# Patient Record
Sex: Female | Born: 1955 | Race: White | Hispanic: No | Marital: Married | Smoking: Former smoker
Health system: Southern US, Community
[De-identification: ages and names within clinical notes are randomized; demographics above are authoritative.]

## PROBLEM LIST (undated history)

## (undated) DIAGNOSIS — E039 Hypothyroidism, unspecified: Secondary | ICD-10-CM

## (undated) DIAGNOSIS — K529 Noninfective gastroenteritis and colitis, unspecified: Secondary | ICD-10-CM

## (undated) DIAGNOSIS — R112 Nausea with vomiting, unspecified: Secondary | ICD-10-CM

## (undated) DIAGNOSIS — H409 Unspecified glaucoma: Secondary | ICD-10-CM

## (undated) DIAGNOSIS — Z9889 Other specified postprocedural states: Secondary | ICD-10-CM

## (undated) DIAGNOSIS — C50312 Malignant neoplasm of lower-inner quadrant of left female breast: Principal | ICD-10-CM

## (undated) DIAGNOSIS — D649 Anemia, unspecified: Secondary | ICD-10-CM

## (undated) DIAGNOSIS — F419 Anxiety disorder, unspecified: Secondary | ICD-10-CM

## (undated) DIAGNOSIS — R5383 Other fatigue: Secondary | ICD-10-CM

## (undated) DIAGNOSIS — J45909 Unspecified asthma, uncomplicated: Secondary | ICD-10-CM

## (undated) HISTORY — PX: EYE SURGERY: SHX253

## (undated) HISTORY — PX: COLONOSCOPY W/ POLYPECTOMY: SHX1380

## (undated) HISTORY — PX: WISDOM TOOTH EXTRACTION: SHX21

## (undated) HISTORY — DX: Hypothyroidism, unspecified: E03.9

## (undated) HISTORY — DX: Unspecified glaucoma: H40.9

## (undated) HISTORY — DX: Malignant neoplasm of lower-inner quadrant of left female breast: C50.312

## (undated) HISTORY — DX: Noninfective gastroenteritis and colitis, unspecified: K52.9

---

## 2008-06-01 ENCOUNTER — Ambulatory Visit: Payer: Self-pay | Admitting: Endocrinology

## 2012-03-07 LAB — TSH: TSH: 2.27 u[IU]/mL (ref <=5.90)

## 2013-08-14 LAB — BASIC METABOLIC PANEL
BUN: 7 mg/dL (ref 4–21)
Creatinine: 0.7 mg/dL (ref <=1.1)
Glucose: 89 mg/dL
Potassium: 4.1 mmol/L (ref 3.4–5.3)
Sodium: 142 mmol/L (ref 137–147)

## 2013-08-14 LAB — LIPID PANEL
CHOLESTEROL: 199 mg/dL (ref 0–200)
HDL: 92 mg/dL — AB (ref 35–70)
LDL Cholesterol: 93 mg/dL
Triglycerides: 68 mg/dL (ref 40–160)

## 2013-08-14 LAB — CBC AND DIFFERENTIAL
HEMATOCRIT: 45 % (ref 36–46)
HEMOGLOBIN: 15.3 g/dL (ref 12.0–16.0)
PLATELETS: 254 10*3/uL (ref 150–399)
WBC: 10.2 10*3/mL

## 2013-08-14 LAB — HEPATIC FUNCTION PANEL
ALK PHOS: 61 U/L (ref 25–125)
ALT: 16 U/L (ref 7–35)
AST: 22 U/L (ref 13–35)
Bilirubin, Total: 0.4 mg/dL

## 2013-08-14 LAB — TSH: TSH: 0.3 u[IU]/mL — AB (ref <=5.90)

## 2013-08-14 LAB — HM PAP SMEAR: HM Pap smear: NEGATIVE

## 2014-01-12 ENCOUNTER — Encounter: Payer: Self-pay | Admitting: Internal Medicine

## 2014-01-15 LAB — HM MAMMOGRAPHY

## 2014-02-07 ENCOUNTER — Ambulatory Visit (AMBULATORY_SURGERY_CENTER): Payer: BC Managed Care – PPO

## 2014-02-07 VITALS — Ht 64.0 in | Wt 143.0 lb

## 2014-02-07 DIAGNOSIS — Z800 Family history of malignant neoplasm of digestive organs: Secondary | ICD-10-CM

## 2014-02-07 MED ORDER — MOVIPREP 100 G PO SOLR: 1.0000 | Freq: Once | ORAL | Status: DC

## 2014-02-07 NOTE — Progress Notes (Signed)
Allergy to eggs but has had flu shot with no problem. No diet/weight loss meds No home oxygen No problems with anesthesia in the past  No relatives with problems with anesthesia  Has email  Emmi instructions given for colonoscopy

## 2014-02-14 ENCOUNTER — Encounter: Payer: Self-pay | Admitting: Internal Medicine

## 2014-02-15 ENCOUNTER — Telehealth: Payer: Self-pay | Admitting: Internal Medicine

## 2014-02-15 NOTE — Telephone Encounter (Signed)
RN instructed on Miralax prep.  Pt voiced understanding.

## 2014-02-21 ENCOUNTER — Encounter: Payer: Self-pay | Admitting: Internal Medicine

## 2014-02-21 ENCOUNTER — Ambulatory Visit (AMBULATORY_SURGERY_CENTER): Payer: BC Managed Care – PPO | Admitting: Internal Medicine

## 2014-02-21 VITALS — BP 138/84 | HR 62 | Temp 98.9°F | Resp 18 | Ht 64.0 in | Wt 143.0 lb

## 2014-02-21 DIAGNOSIS — Z800 Family history of malignant neoplasm of digestive organs: Secondary | ICD-10-CM

## 2014-02-21 DIAGNOSIS — D126 Benign neoplasm of colon, unspecified: Secondary | ICD-10-CM

## 2014-02-21 DIAGNOSIS — Z8379 Family history of other diseases of the digestive system: Secondary | ICD-10-CM

## 2014-02-21 DIAGNOSIS — Z1211 Encounter for screening for malignant neoplasm of colon: Secondary | ICD-10-CM

## 2014-02-21 MED ORDER — SODIUM CHLORIDE 0.9 % IV SOLN: 500.0000 mL | INTRAVENOUS | Status: DC

## 2014-02-21 NOTE — Op Note (Signed)
[facility]  [address]. [place], [address]   COLONOSCOPY PROCEDURE REPORT  PATIENT: Laura Landry, Laura Landry  MR#: [id number] BIRTHDATE: [birth year] , 22  yrs. old GENDER: Female ENDOSCOPIST: Lafayette Dragon, MD REFERRED JQ:[id number] Little, M.D. PROCEDURE DATE:  02/21/2014 PROCEDURE:   Colonoscopy with biopsy, Colonoscopy with snare polypectomy, and Colonoscopy with cold biopsy polypectomy First Screening Colonoscopy - Avg.  risk and is 50 yrs.  old or older - No.  Prior Negative Screening - Now for repeat screening. 10 or more years since last screening  History of Adenoma - Now for follow-up colonoscopy & has been > or = to 3 yrs.  N/A  Polyps Removed Today? Yes. 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  Not treated.  Positive family history of ulcerative colitis in a grandparent.  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 MEDICATIONS: MAC sedation, administered by CRNA and Propofol (Diprivan) 320 mg IV  DESCRIPTION OF PROCEDURE:   After the risks benefits and alternatives of the procedure were thoroughly explained, informed consent was obtained.  A digital rectal exam revealed no abnormalities of the rectum.   The LB [id number]  endoscope was introduced through the anus and advanced to the cecum, which was identified by both the appendix and ileocecal valve. No adverse events experienced.   The quality of the prep was good, using MoviPrep  The instrument was then slowly withdrawn as the colon was fully examined.      COLON FINDINGS: Two pedunculated polyps were found in the sigmoid colon. large 15 mm pedunculated polyp at 20 cm, and 7 mm sessile polyp at 15 cm, Botth [polyps removed , with hot snare and with cold biopsy. random biopsies of the colon were obtained from the right and the descending colon to rule out nonspecific  colitis . Retroflexed views revealed no abnormalities. The time to cecum=8 minutes 32 seconds.  Withdrawal time=15 minutes 57 seconds.  The scope was withdrawn and the procedure completed. COMPLICATIONS: There were no complications.  ENDOSCOPIC IMPRESSION: Two pedunculated polyp  at 20 cm and sessile polyp at 15 cm were found in the sigmoid colon; polypectomy was performed with cold forceps and with the cold snare  Multiple biopsies from right and the left colon were obtained to follow up on nonspecific colitis from colonoscopy in 1998, endoscopically mucosa appears normal  RECOMMENDATIONS: 1.  Await pathology results 2.  recall colonoscopy pending path report   eSigned:  Lafayette Dragon, MD 02/21/2014 2:13 PM   cc:   PATIENT NAME:  Bernyce, Brimley MR#: [id number]

## 2014-02-21 NOTE — Progress Notes (Signed)
Called to room to assist during endoscopic procedure.  Patient ID and intended procedure confirmed with present staff. Received instructions for my participation in the procedure from the performing physician.  

## 2014-02-21 NOTE — Progress Notes (Signed)
Procedure ends, to recovery, report given and VSS. 

## 2014-02-21 NOTE — Patient Instructions (Signed)
Discharge instructions given with verbal understanding. Handout on polyps. Resume previous medications. YOU HAD AN ENDOSCOPIC PROCEDURE TODAY AT THE [facility]: Refer to the procedure report that was given to you for any specific questions about what was found during the examination.  If the procedure report does not answer your questions, please call your gastroenterologist to clarify.  If you requested that your care partner not be given the details of your procedure findings, then the procedure report has been included in a sealed envelope for you to review at your convenience later.  YOU SHOULD EXPECT: Some feelings of bloating in the abdomen. Passage of more gas than usual.  Walking can help get rid of the air that was put into your GI tract during the procedure and reduce the bloating. If you had a lower endoscopy (such as a colonoscopy or flexible sigmoidoscopy) you may notice spotting of blood in your stool or on the toilet paper. If you underwent a bowel prep for your procedure, then you may not have a normal bowel movement for a few days.  DIET: Your first meal following the procedure should be a light meal and then it is ok to progress to your normal diet.  A half-sandwich or bowl of soup is an example of a good first meal.  Heavy or fried foods are harder to digest and may make you feel nauseous or bloated.  Likewise meals heavy in dairy and vegetables can cause extra gas to form and this can also increase the bloating.  Drink plenty of fluids but you should avoid alcoholic beverages for 24 hours.  ACTIVITY: Your care partner should take you home directly after the procedure.  You should plan to take it easy, moving slowly for the rest of the day.  You can resume normal activity the day after the procedure however you should NOT DRIVE or use heavy machinery for 24 hours (because of the sedation medicines used during the test).    SYMPTOMS TO REPORT IMMEDIATELY: A  gastroenterologist can be reached at any hour.  During normal business hours, 8:30 AM to 5:00 PM Monday through Friday, call [phone].  After hours and on weekends, please call the GI answering service at [phone] who will take a message and have the physician on call contact you.   Following lower endoscopy (colonoscopy or flexible sigmoidoscopy):  Excessive amounts of blood in the stool  Significant tenderness or worsening of abdominal pains  Swelling of the abdomen that is new, acute  Fever of 100F or higher  FOLLOW UP: If any biopsies were taken you will be contacted by phone or by letter within the next 1-3 weeks.  Call your gastroenterologist if you have not heard about the biopsies in 3 weeks.  Our staff will call the home number listed on your records the next business day following your procedure to check on you and address any questions or concerns that you may have at that time regarding the information given to you following your procedure. This is a courtesy call and so if there is no answer at the home number and we have not heard from you through the emergency physician on call, we will assume that you have returned to your regular daily activities without incident.  SIGNATURES/CONFIDENTIALITY: You and/or your care partner have signed paperwork which will be entered into your electronic medical record.  These signatures attest to the fact that that the information above on your After Visit Summary has been   reviewed and is understood.  Full responsibility of the confidentiality of this discharge information lies with you and/or your care-partner. 

## 2014-02-22 ENCOUNTER — Telehealth: Payer: Self-pay

## 2014-02-22 NOTE — Telephone Encounter (Signed)
  Follow up Call-  Call back number 02/21/2014  Post procedure Call Back phone  # [phone]  Permission to leave phone message Yes     Patient questions:  Do you have a fever, pain , or abdominal swelling? No. Pain Score  0 *  Have you tolerated food without any problems? Yes.    Have you been able to return to your normal activities? Yes.    Do you have any questions about your discharge instructions: Diet   No. Medications  No. Follow up visit  No.  Do you have questions or concerns about your Care? No.  Actions: * If pain score is 4 or above: No action needed, pain <4.

## 2014-02-27 ENCOUNTER — Encounter: Payer: Self-pay | Admitting: Internal Medicine

## 2014-03-01 ENCOUNTER — Encounter: Payer: Self-pay | Admitting: *Deleted

## 2014-03-01 ENCOUNTER — Encounter: Payer: Self-pay | Admitting: Internal Medicine

## 2015-05-07 LAB — CBC AND DIFFERENTIAL
HEMATOCRIT: 44 % (ref 36–46)
Hemoglobin: 14.7 g/dL (ref 12.0–16.0)
Platelets: 301 10*3/uL (ref 150–399)
WBC: 10.2 10^3/mL

## 2015-05-07 LAB — LIPID PANEL
CHOLESTEROL: 188 mg/dL (ref 0–200)
HDL: 79 mg/dL — AB (ref 35–70)
LDL Cholesterol: 92 mg/dL
Triglycerides: 84 mg/dL (ref 40–160)

## 2015-05-07 LAB — HEPATIC FUNCTION PANEL
ALT: 21 U/L (ref 7–35)
AST: 25 U/L (ref 13–35)
Alkaline Phosphatase: 25 U/L (ref 25–125)
BILIRUBIN, TOTAL: 0.5 mg/dL

## 2015-05-07 LAB — BASIC METABOLIC PANEL
BUN: 6 mg/dL (ref 4–21)
CREATININE: 0.6 mg/dL (ref <=1.1)
GLUCOSE: 82 mg/dL
POTASSIUM: 4.2 mmol/L (ref 3.4–5.3)
SODIUM: 138 mmol/L (ref 137–147)

## 2015-05-07 LAB — TSH: TSH: 0.07 u[IU]/mL — AB (ref <=5.90)

## 2015-08-15 LAB — HM PAP SMEAR: HM PAP: NEGATIVE

## 2015-12-23 ENCOUNTER — Other Ambulatory Visit: Payer: Self-pay | Admitting: Family Medicine

## 2015-12-23 DIAGNOSIS — M4727 Other spondylosis with radiculopathy, lumbosacral region: Secondary | ICD-10-CM

## 2015-12-23 DIAGNOSIS — M5440 Lumbago with sciatica, unspecified side: Secondary | ICD-10-CM

## 2015-12-24 ENCOUNTER — Ambulatory Visit
Admission: RE | Admit: 2015-12-24 | Discharge: 2015-12-24 | Disposition: A | Discharge status: Home / Self Care | Payer: BLUE CROSS/BLUE SHIELD | Source: Ambulatory Visit | Attending: Family Medicine | Admitting: Family Medicine

## 2015-12-24 ENCOUNTER — Other Ambulatory Visit: Payer: Self-pay | Admitting: Family Medicine

## 2015-12-24 DIAGNOSIS — M5440 Lumbago with sciatica, unspecified side: Secondary | ICD-10-CM

## 2015-12-29 ENCOUNTER — Other Ambulatory Visit: Payer: Self-pay

## 2016-01-07 ENCOUNTER — Ambulatory Visit (INDEPENDENT_AMBULATORY_CARE_PROVIDER_SITE_OTHER): Payer: BLUE CROSS/BLUE SHIELD | Admitting: Family Medicine

## 2016-01-07 ENCOUNTER — Encounter: Payer: Self-pay | Admitting: Family Medicine

## 2016-01-07 VITALS — BP 144/87 | HR 80 | Ht 65.0 in | Wt 152.3 lb

## 2016-01-07 DIAGNOSIS — L255 Unspecified contact dermatitis due to plants, except food: Secondary | ICD-10-CM

## 2016-01-07 DIAGNOSIS — E039 Hypothyroidism, unspecified: Secondary | ICD-10-CM

## 2016-01-07 MED ORDER — PREDNISONE 50 MG PO TABS: 50.0000 mg | ORAL_TABLET | Freq: Every day | ORAL | Status: DC

## 2016-01-07 NOTE — Patient Instructions (Signed)
Take all meds as written.     Poison [employer] ivy is a inflammation of the skin (contact dermatitis) caused by touching the allergens on the leaves of the ivy plant following previous exposure to the plant. The rash usually appears 48 hours after exposure. The rash is usually bumps (papules) or blisters (vesicles) in a linear pattern. Depending on your own sensitivity, the rash may simply cause redness and itching, or it may also progress to blisters which may break open. These must be well cared for to prevent secondary bacterial (germ) infection, followed by scarring. Keep any open areas dry, clean, dressed, and covered with an antibacterial ointment if needed. The eyes may also get puffy. The puffiness is worst in the morning and gets better as the day progresses. This dermatitis usually heals without scarring, within 2 to 3 weeks without treatment. HOME CARE INSTRUCTIONS  Thoroughly wash with soap and water as soon as you have been exposed to poison ivy. You have about one half hour to remove the plant resin before it will cause the rash. This washing will destroy the oil or antigen on the skin that is causing, or will cause, the rash. Be sure to wash under your fingernails as any plant resin there will continue to spread the rash. Do not rub skin vigorously when washing affected area. Poison ivy cannot spread if no oil from the plant remains on your body. A rash that has progressed to weeping sores will not spread the rash unless you have not washed thoroughly. It is also important to wash any clothes you have been wearing as these may carry active allergens. The rash will return if you wear the unwashed clothing, even several days later. Avoidance of the plant in the future is the best measure. Poison ivy plant can be recognized by the number of leaves. Generally, poison ivy has three leaves with flowering branches on a single stem. Diphenhydramine may be purchased over the counter and used as needed  for itching. Do not drive with this medication if it makes you drowsy.Ask your caregiver about medication for children. SEEK MEDICAL CARE IF:  Open sores develop.  Redness spreads beyond area of rash.  You notice purulent (pus-like) discharge.  You have increased pain.  Other signs of infection develop (such as fever).   This information is not intended to replace advice given to you by your health care provider. Make sure you discuss any questions you have with your health care provider.   Document Released: 07/17/2000 Document Revised: 10/12/2011 Document Reviewed: 12/26/2014 Elsevier Interactive Patient Education [employer].

## 2016-01-07 NOTE — Progress Notes (Signed)
Laura Landry, D.O. Primary care at [facility]  Subjective:    CC: New pt, here to establish care and be txed for poison ivy rash.  HPI: Laura Landry is a pleasant 61 y.o. female who presents to [place] at [facility] today for a rash started Sunday, on b/l legs, then moved to hands/arms, today now one papule on face.  Pulling weeds in yard on Friday- known poison ivy and now has pustular outbreak.  No oral lesions or DIB.   Has tolerated prednisone in the past.    Pt's PCP was Dr Rex Kras in [place] and was being worked up for back pain with him. Pt not sure if she will f/up with him about this or me.  Had x-rays- Neg.     Past Medical History  Diagnosis Date  . Hypothyroidism   . Colitis     Past Surgical History  Procedure Laterality Date  . Wisdom tooth extraction      Family History  Problem Relation Age of Onset  . Colon cancer Paternal Grandmother   . Cancer Paternal Grandmother     colon  . Cancer Mother     breast  . Hypertension Father   . Asthma Father   . Diabetes Brother     History  Drug Use No  ,  History  Alcohol Use  . 2.4 oz/week  . 4 Glasses of wine per week  ,  History  Smoking status  . Former Smoker  . Quit date: 08/03/2012  Smokeless tobacco  . Never Used  ,  History  Sexual Activity  . Sexual Activity: Yes  . Birth Control/ Protection: Post-menopausal    Patient's Medications  New Prescriptions   No medications on file  Previous Medications   ALBUTEROL (PROVENTIL HFA;VENTOLIN HFA) 108 (90 BASE) MCG/ACT INHALER    Inhale 1-2 puffs into the lungs every 6 (six) hours as needed for wheezing or shortness of breath.   CALCIUM CARBONATE-VITAMIN D (CALCIUM-VITAMIN D) 500-200 MG-UNIT TABLET    Take 1 tablet by mouth daily.   LEVOTHYROXINE (SYNTHROID, LEVOTHROID) 112 MCG TABLET    Take 112 mcg by mouth daily before breakfast.  Modified Medications   No medications on file  Discontinued Medications   No  medications on file    ALLERGIES: Clindamycin/lincomycin; Codeine; Eggs or egg-derived products; Nickel; Penicillins; and Sulfa antibiotics   Review of Systems: Full 14 point ROS performed via "adult medical history form".  Negative except for noted above    Objective:   Blood pressure 144/87, pulse 80, height 5\' 5"  (1.651 m), weight 152 lb 4.8 oz (69.083 kg). Body mass index is 25.34 kg/(m^2).  General: Well Developed, well nourished, and in no acute distress.  Neuro: Alert and oriented x3, extra-ocular muscles intact, sensation grossly intact.  HEENT: Normocephalic, atraumatic, pupils equal round reactive to light, neck supple, no gross masses, no carotid bruits, no JVD apprec Skin: no gross suspicious lesions or rashes  Cardiac: Regular rate and rhythm, no murmurs rubs or gallops.  Respiratory: Essentially clear to auscultation bilaterally. Not using accessory muscles, speaking in full sentences.  Abdominal: Soft, not grossly distended Musculoskeletal: Ambulates w/o diff, FROM * 4 ext.  Vasc: less 2 sec cap RF, warm and pink  Psych:  No HI/SI, judgement and insight good.    Impression and Recommendations:    The patient was counselled, risk factors were discussed, anticipatory guidance given.  Gross side effects, risk and  benefits, and alternatives of medications discussed with patient.  Patient is aware that all medications have potential side effects and we are unable to predict every sideeffect or drug-drug interaction that may occur.  Expresses verbal understanding and consents to current therapy plan and treatment regiment.  Note: This document was prepared using Dragon voice recognition software and may include unintentional dictation errors.

## 2016-01-08 DIAGNOSIS — E039 Hypothyroidism, unspecified: Secondary | ICD-10-CM | POA: Insufficient documentation

## 2016-01-08 NOTE — Assessment & Plan Note (Signed)
R/b meds;   supportive care d/c pt.    Red flag sx.

## 2016-01-08 NOTE — Assessment & Plan Note (Signed)
rec we get blood work in near future if pt plans to tx care to [place].

## 2016-01-08 NOTE — Progress Notes (Signed)
Subjective:    CC: here for f/up of Poison Ivy rash and elevated BP last OV   HPI: Laura Landry is a 60 y.o. female who presents to [place] at [facility] today for f/up of her rhus dermatitis she contracted less than a week ago.  She is tolerating the prednisone well- makes her feel hungry, lightheaded, and get little sleep.  Rash regressing and pt pleased with how it is resolving.      BP- checked at home one time-  140/80, 88=P.   No sx at all, no complaints.    Past Medical History  Diagnosis Date  . Hypothyroidism   . Colitis     Past Surgical History  Procedure Laterality Date  . Wisdom tooth extraction      Family History  Problem Relation Age of Onset  . Colon cancer Paternal Grandmother   . Cancer Paternal Grandmother     colon  . Cancer Mother     breast  . Hypertension Father   . Asthma Father   . Diabetes Brother     History  Drug Use No  ,  History  Alcohol Use  . 2.4 oz/week  . 4 Glasses of wine per week  ,  History  Smoking status  . Former Smoker  . Quit date: 08/03/2012  Smokeless tobacco  . Never Used  ,  History  Sexual Activity  . Sexual Activity: Yes  . Birth Control/ Protection: Post-menopausal    Current Outpatient Prescriptions on File Prior to Visit  Medication Sig Dispense Refill  . albuterol (PROVENTIL HFA;VENTOLIN HFA) 108 (90 Base) MCG/ACT inhaler Inhale 1-2 puffs into the lungs every 6 (six) hours as needed for wheezing or shortness of breath.    . Calcium Carbonate-Vitamin D (CALCIUM-VITAMIN D) 500-200 MG-UNIT tablet Take 1 tablet by mouth daily.    Marland Kitchen levothyroxine (SYNTHROID, LEVOTHROID) 112 MCG tablet Take 112 mcg by mouth daily before breakfast.    . predniSONE (DELTASONE) 50 MG tablet Take 1 tablet (50 mg total) by mouth daily. 5 tablet 0   No current facility-administered medications on file prior to visit.    Allergies  Allergen Reactions  . Clindamycin/Lincomycin Swelling  . Codeine    Same as sulfa   . Eggs Or Egg-Derived Products     Abdominal pain some swelling  . Nickel     Blisters   . Penicillins Hives, Nausea And Vomiting and Swelling  . Sulfa Antibiotics Hives and Swelling     Review of Systems: General:  Denies fever, chills, appetite changes, unexplained weight loss.  Respiratory: Denies SOB, DOE, cough, wheezing.  Cardiovascular: Denies chest pain, palpitations.  Gastrointestinal: Denies nausea, vomiting, diarrhea, abdominal pain.  Genitourinary: Denies dysuria, increased frequency, flank pain. Endocrine: Denies hot or cold intolerance, polyuria, polydipsia. Musculoskeletal: Denies myalgias, back pain, joint swelling, arthralgias, gait problems.  Skin: Denies pallor, rash, suspicious lesions.  Neurological: Denies dizziness, seizures, syncope, unexplained weakness, lightheadedness, numbness and headaches.  Psychiatric/Behavioral: Denies mood changes, suicidal or homicidal ideations, hallucinations, sleep disturbances.   Objective:  Blood pressure 142/88, pulse 76. Filed Vitals:   01/09/16 1442 01/09/16 1518  BP: 149/90 142/88  Pulse: 76   height 5\' 5"  (1.651 m), weight 152 lb 4.8 oz (69.083 kg). Body mass index is 25.34 kg/(m^2).   General: Well Developed, well nourished, and in no acute distress.  HEENT: Normocephalic, atraumatic, pupils equal round reactive to light, neck supple, No carotid bruits no JVD Skin: Warm  and dry, cap RF less 2 sec, resolving pustular rash- no longer on B/L ankles/arms and face. Still on upper inner R lower ext. Cardiac: Regular rate and rhythm, S1, S2 WNL's, no murmurs rubs or gallops Respiratory: ECTA B/L, Not using accessory muscles, speaking in full sentences. NeuroM-Sk: Ambulates w/o assistance, moves ext * 4 w/o difficulty, sensation grossly intact.  Psych: A and O *3, judgement and insight good.     Impression and Recommendations:   Prehypertension Patient will check BP at home and keep a blood pressure  log. I asked her to check at least 3 times a week- random times. Gave pt handouts on low-salt diet and healthier eating d/c pt; rec exercise 64min per day as well.  Pt understands that BP goal at 60yo is 150/90 or less and younger is 140/90 or less.   So, long d/c pt about this and ways to bring it down on her own.  She declines meds today and will monitor at home.   Rhus dermatitis Clearing and drying up nicely. She will continue meds until completed her regimen. Avoid poison ivy plant in the future   --> pt will schedule FBW in near future and f/up 3-4 wks with me for OV to recheck BP and review labs.        Note: This document was prepared using Dragon voice recognition software and may include unintentional dictation errors.

## 2016-01-09 ENCOUNTER — Ambulatory Visit (INDEPENDENT_AMBULATORY_CARE_PROVIDER_SITE_OTHER): Payer: BLUE CROSS/BLUE SHIELD | Admitting: Family Medicine

## 2016-01-09 ENCOUNTER — Encounter: Payer: Self-pay | Admitting: Family Medicine

## 2016-01-09 ENCOUNTER — Telehealth: Payer: Self-pay | Admitting: Family Medicine

## 2016-01-09 ENCOUNTER — Ambulatory Visit: Payer: BLUE CROSS/BLUE SHIELD | Admitting: Family Medicine

## 2016-01-09 VITALS — BP 142/88 | HR 76

## 2016-01-09 DIAGNOSIS — L255 Unspecified contact dermatitis due to plants, except food: Secondary | ICD-10-CM | POA: Diagnosis not present

## 2016-01-09 DIAGNOSIS — R030 Elevated blood-pressure reading, without diagnosis of hypertension: Secondary | ICD-10-CM | POA: Diagnosis not present

## 2016-01-09 NOTE — Patient Instructions (Signed)
Guidelines for a Low Sodium Diet   Low Sodium Diet A main source of sodium is table salt. The average American eats five or more teaspoons of salt each day. This is about 20 times as much as the body needs. In fact, your body needs only 1/4 teaspoon of salt every day. Sodium is found naturally in foods, but a lot of it is added during processing and preparation. Many foods that do not taste salty may still be high in sodium. Large amounts of sodium can be hidden in canned, processed and convenience foods. And sodium can be found in many foods that are served at fast food restaurants.  Sodium controls fluid balance in our bodies and maintains blood volume and blood pressure. Eating too much sodium may raise blood pressure and cause fluid retention, which could lead to swelling of the legs and feet or other health issues.  When limiting sodium in your diet, a common target is to eat less than 2,000 milligrams of sodium per day.   General Guidelines for Cutting Down on Salt Eliminate salty foods from your diet and reduce the amount of salt used in cooking. Sea salt is no better than regular salt.  Choose low sodium foods. Many salt-free or reduced salt products are available. When reading food labels, low sodium is defined as 140 mg of sodium per serving.  Salt substitutes are sometimes made from potassium, so read the label. If you are on a low potassium diet, then check with your doctor before using those salt substitutes.  Be creative and season your foods with spices, herbs, lemon, garlic, ginger, vinegar and pepper. Remove the salt shaker from the table.  Read ingredient labels to identify foods high in sodium. Items with 400 mg or more of sodium are high in sodium. High sodium food additives include salt, brine, or other items that say sodium, such as monosodium glutamate.  Eat more home-cooked meals. Foods cooked from scratch are naturally lower in sodium than most instant and boxed  mixes.  Don't use softened water for cooking and drinking since it contains added salt.  Avoid medications which contain sodium such as Alka Seltzer and Bromo Seltzer.  For more information; food composition books are available which tell how much sodium is in food. Online sources such as www.calorieking.com also list amounts.     Meats, Poultry, Fish, Legumes, Eggs and Nuts  High-Sodium Foods: Smoked, cured, salted or canned meat, fish or poultry including bacon, cold cuts, ham, frankfurters, sausage, sardines, caviar and anchovies Frozen breaded meats and dinners, such as burritos and pizza Canned entrees, such as ravioli, spam and chili Salted nuts Beans canned with salt added  Low-Sodium Alternatives: Any fresh or frozen beef, lamb, pork, poultry and fish Eggs and egg substitutes Low-sodium peanut butter Dry peas and beans (not canned) Low-sodium canned fish Drained, water or oil packed canned fish or poultry   Dairy Products  High-Sodium Foods: Buttermilk Regular and processed cheese, cheese spreads and sauces Cottage cheese  Low-Sodium Alternatives: Milk, yogurt, ice cream and ice milk Low-sodium cheeses, cream cheese, ricotta cheese and mozzarella   Breads, Grains and Cereals  High-Sodium Foods: Bread and rolls with salted tops Quick breads, self-rising flour, biscuit, pancake and waffle mixes Pizza, croutons and salted crackers Prepackaged, processed mixes for potatoes, rice, pasta and stuffing  Low-Sodium Alternatives: Breads, bagels and rolls without salted tops Muffins and most ready-to-eat cereals All rice and pasta, but do not to add salt when cooking Low-sodium corn and   flour tortillas and noodles Low-sodium crackers and breadsticks Unsalted popcorn, chips and pretzels      Vegetables and Fruits  High-Sodium Foods: Regular canned vegetables and vegetable juices Olives, pickles, sauerkraut and other pickled vegetables Vegetables made with  ham, bacon or salted pork Packaged mixes, such as scalloped or au gratin potatoes, frozen hash browns and Tater Tots Commercially prepared pasta and tomato sauces and salsa  Low-Sodium Alternatives: Fresh and frozen vegetables without sauces Low-sodium canned vegetables, sauces and juices Fresh potatoes, frozen French fries and instant mashed potatoes Low-salt tomato or V-8 juice. Most fresh, frozen and canned fruit Dried fruits   Soups  High-Sodium Foods: Regular canned and dehydrated soup, broth and bouillon Cup of noodles and seasoned ramen mixes  Low-Sodium Alternatives: Low-sodium canned and dehydrated soups, broth and bouillon Homemade soups without added salt   Fats, Desserts and Sweets  High-Sodium Foods: Soy sauce, seasoning salt, other sauces and marinades Bottled salad dressings, regular salad dressing with bacon bits Salted butter or margarine Instant pudding and cake Large portions of ketchup, mustard  Low-Sodium Alternatives: Vinegar, unsalted butter or margarine Vegetable oils and low sodium sauces and salad dressings Mayonnaise All desserts made without salt 

## 2016-01-09 NOTE — Telephone Encounter (Signed)
Patient would like a 5 month supply of her thyroid meds called in to her pharmacy

## 2016-01-09 NOTE — Assessment & Plan Note (Addendum)
Patient will check BP at home and keep a blood pressure log. I asked her to check at least 3 times a week- random times. Gave pt handouts on low-salt diet and healthier eating d/c pt; rec exercise 64min per day as well.  Pt understands that BP goal at 60yo is 150/90 or less and younger is 140/90 or less.   So, long d/c pt about this and ways to bring it down on her own.  She declines meds today and will monitor at home.

## 2016-01-09 NOTE — Telephone Encounter (Signed)
Tell pt that we will await her upcoming labs before I RF her meds for 47mo to make sure all levels look good.  Then I would have no problem refilling 6 months if everything looks good

## 2016-01-09 NOTE — Assessment & Plan Note (Addendum)
Clearing and drying up nicely. She will continue meds until completed her regimen. Avoid poison ivy plant in the future

## 2016-01-10 NOTE — Telephone Encounter (Signed)
Pt informed and is agreeable.  Charyl Bigger, CMA

## 2016-01-15 DIAGNOSIS — Z1231 Encounter for screening mammogram for malignant neoplasm of breast: Secondary | ICD-10-CM

## 2016-01-31 ENCOUNTER — Other Ambulatory Visit: Payer: Self-pay

## 2016-01-31 DIAGNOSIS — R030 Elevated blood-pressure reading, without diagnosis of hypertension: Secondary | ICD-10-CM

## 2016-01-31 DIAGNOSIS — E039 Hypothyroidism, unspecified: Secondary | ICD-10-CM

## 2016-01-31 DIAGNOSIS — Z0000 Encounter for general adult medical examination without abnormal findings: Secondary | ICD-10-CM

## 2016-02-03 ENCOUNTER — Other Ambulatory Visit (INDEPENDENT_AMBULATORY_CARE_PROVIDER_SITE_OTHER): Payer: BLUE CROSS/BLUE SHIELD

## 2016-02-03 DIAGNOSIS — Z0000 Encounter for general adult medical examination without abnormal findings: Secondary | ICD-10-CM | POA: Diagnosis not present

## 2016-02-03 DIAGNOSIS — E039 Hypothyroidism, unspecified: Secondary | ICD-10-CM

## 2016-02-03 DIAGNOSIS — R030 Elevated blood-pressure reading, without diagnosis of hypertension: Secondary | ICD-10-CM

## 2016-02-04 LAB — COMPREHENSIVE METABOLIC PANEL
ALK PHOS: 57 U/L (ref 33–130)
ALT: 13 U/L (ref 6–29)
AST: 18 U/L (ref 10–35)
Albumin: 4.3 g/dL (ref 3.6–5.1)
BUN: 8 mg/dL (ref 7–25)
CO2: 24 mmol/L (ref 20–31)
Calcium: 10 mg/dL (ref 8.6–10.4)
Chloride: 106 mmol/L (ref 98–110)
Creat: 0.61 mg/dL (ref 0.50–1.05)
GLUCOSE: 93 mg/dL (ref 65–99)
POTASSIUM: 5.2 mmol/L (ref 3.5–5.3)
Sodium: 142 mmol/L (ref 135–146)
Total Bilirubin: 0.6 mg/dL (ref 0.2–1.2)
Total Protein: 6.3 g/dL (ref 6.1–8.1)

## 2016-02-04 LAB — LIPID PANEL
CHOL/HDL RATIO: 2 ratio (ref <=5.0)
Cholesterol: 184 mg/dL (ref 125–200)
HDL: 90 mg/dL (ref >=46)
LDL Cholesterol: 80 mg/dL (ref <130)
TRIGLYCERIDES: 70 mg/dL (ref <150)
VLDL: 14 mg/dL (ref <30)

## 2016-02-04 LAB — CBC WITH DIFFERENTIAL/PLATELET
BASOS PCT: 0 %
Basophils Absolute: 0 cells/uL (ref 0–200)
Eosinophils Absolute: 291 cells/uL (ref 15–500)
Eosinophils Relative: 3 %
HCT: 44.7 % (ref 35.0–45.0)
Hemoglobin: 14.7 g/dL (ref 11.7–15.5)
Lymphocytes Relative: 30 %
Lymphs Abs: 2910 cells/uL (ref 850–3900)
MCH: 32.8 pg (ref 27.0–33.0)
MCHC: 32.9 g/dL (ref 32.0–36.0)
MCV: 99.8 fL (ref 80.0–100.0)
MONOS PCT: 6 %
MPV: 10.2 fL (ref 7.5–12.5)
Monocytes Absolute: 582 cells/uL (ref 200–950)
Neutro Abs: 5917 cells/uL (ref 1500–7800)
Neutrophils Relative %: 61 %
PLATELETS: 291 10*3/uL (ref 140–400)
RBC: 4.48 MIL/uL (ref 3.80–5.10)
RDW: 13.6 % (ref 11.0–15.0)
WBC: 9.7 10*3/uL (ref 3.8–10.8)

## 2016-02-04 LAB — VITAMIN D 25 HYDROXY (VIT D DEFICIENCY, FRACTURES): VIT D 25 HYDROXY: 45 ng/mL (ref 30–100)

## 2016-02-04 LAB — HEMOGLOBIN A1C
Hgb A1c MFr Bld: 5.2 % (ref <5.7)
Mean Plasma Glucose: 103 mg/dL

## 2016-02-04 LAB — TSH: TSH: 0.11 mIU/L — ABNORMAL LOW

## 2016-02-14 ENCOUNTER — Encounter: Payer: Self-pay | Admitting: Family Medicine

## 2016-02-14 ENCOUNTER — Ambulatory Visit (INDEPENDENT_AMBULATORY_CARE_PROVIDER_SITE_OTHER): Payer: BLUE CROSS/BLUE SHIELD | Admitting: Family Medicine

## 2016-02-14 VITALS — BP 149/94 | HR 91 | Ht 65.0 in | Wt 148.1 lb

## 2016-02-14 DIAGNOSIS — Z1239 Encounter for other screening for malignant neoplasm of breast: Secondary | ICD-10-CM | POA: Diagnosis not present

## 2016-02-14 DIAGNOSIS — E038 Other specified hypothyroidism: Secondary | ICD-10-CM | POA: Diagnosis not present

## 2016-02-14 DIAGNOSIS — R030 Elevated blood-pressure reading, without diagnosis of hypertension: Secondary | ICD-10-CM

## 2016-02-14 DIAGNOSIS — Z87891 Personal history of nicotine dependence: Secondary | ICD-10-CM

## 2016-02-14 MED ORDER — LEVOTHYROXINE SODIUM 100 MCG PO TABS: 100.0000 ug | ORAL_TABLET | Freq: Every day | ORAL | Status: DC

## 2016-02-14 NOTE — Patient Instructions (Signed)
Change in dose of Synthroid from 112-100 g daily. We will recheck her TSH T3 and T4 in 6-8 weeks. If you have any problems before then please let me know.

## 2016-02-14 NOTE — Progress Notes (Signed)
-      Subjective:    Chief Complaint  Patient presents with  . Hypertension    Pt brought her own BP machine.  Our reading was compared to her machine's reading  . Results    review labs    HPI: Laura Landry is a 60 y.o. female who presents to [place] at [facility] today To discuss her thyroid results and address any concerns she may have regarding that.  See recent email correspondence.  Patient Email Open    02/07/2016 [facility] [place], [facility]   Conversation: Lab test results  [facility])  February 07, 2016  Laura Landry, CMA  to Laura Landry       8:49 AM  Ms. Laura Landry,   Dr. Raliegh Scarlet wanted you to know that she understands your desire to stay on the current dose of thyroid replacement hormone, yet she wants you to be aware that having excess thyroid hormone in your system can speed up all of your organ systems. She also wants to make sure that you will be at risk for developing nervousness, irritability, increased perspiration, heart racing, hand tremors, anxiety, difficulty sleeping, thinning of your skin, fine brittle hair and weakness in your muscles - especially in the upper arms and thighs. You may also have more frequent bowel movements and a host of other symptoms.    Please take these potential complications from the excess thyroid hormone into consideration and if you decide you'd like to lower your dosage, please let our office know so that we can send in a prescription for a more appropriate dosage.   Please don't hesitate to call should you have further questions!   Wishing you well,  Laura Landry, CMA    Last read by Laura Landry at 4:48 PM on 02/07/2016.  Laura Landry  to Laura Landry, CMA       3:43 PM  Thank you for the concern. I really appreciate everything you have done for [place]. I noticed you only did a TSH. I have the last lab Dr. Ronnald Collum did in 2013 before I  decided to get a doctor closer. My thyroid panel from 2013 is: Thyroxine (T4) -.91 TSH-2.27  Triiodothyronine, Free Serum-2.3. I know different labs have different norms. So I guess my question is since I have been on synthyroid 112 for 17+ years and I know when you are on it this long your thyroid no longer secretes this hormone and the pituitary knows when you have enough, how did we get a TSH of 11. Are they making the pills stronger? In 2015 my TSH was 0.305 Low: norms (.350-4.50). I really think we need to take another test and look with a full Thyroid Panel. This can't be right. Please schedule a full thyroid panel for me.  Sincerely,  Laura Landry  to Laura Landry, CMA       3:54 PM  I will call Dr. Rex Kras Monday and find out what the lab results were in December of 2016. I was called and told everything was fine. so 6 months shouldn't make that much difference.  Sincerely,  Laura Landry   February 10, 2016        9:31 AM  Narda Rutherford, CMA routed this conversation to Me        9:32 AM  Narda Rutherford, CMA routed this conversation  to Me  Me  to [name]       1:50 PM  Good afternoon [name]. This is Dr. Raliegh Scarlet. I'm sorry if you misunderstood what we communicated to prior but your TSH was not 11 it was 0.11.    Here was your past results below. You can see your thyroid stimulating hormone levels were low 2 years ago (0.30) and now they are lower (0.11) .  This means that you need to back off on your thyroid medicine however my CMA said that you did not want to do that.   At this point, I think it would be best to make a follow-up office visit with me so I can discuss it with you in person and allow you the opportunity to ask me any questions you may have about it.                         7d ago       9yrago    TSH mIU/L 0.11 (L) 0.30 (A)R   Comments:   Reference Range    > or = 20 Years 0.40-4.50      All my best, Dr OJenetta Downer   Last read by DCoral Ceoat 3:52 PM on 02/10/2016.  DCoral Landry to TFonnie Landry CMA       4:48 PM  Dear [name]  I did research & found that [facility])being on Prednisone for any period of time(since it suppresses TSH)were having spikes in their TSH when coming off of it.Also It said that a TSH test should not be done for 6-8 weeks after finishing steroids or having an x-ray,meds should only be reduced if ft3 and ft4 are tested and found to be high. I went through menopause that minuses out another hormone.I got my labs from 05/08/15 my TSH was 0.069, my TSH has been fluctuating.Everything says a full thyroid panel is suggested with T-4 and T-3.I really didn't pay attention to my labs,as I was told everything was fine.Looking at them I see it is not fine.I have no symptoms of being hyper.I want your opinion.I can go back to Dr.Morayati as adrenal gland specialist(I was very sick years ago and he helped me)or can you monitor me with full scans to see where I am.I don't want to be sick like that again very scary.What you would advise?  Laura Landry  February 11, 2016        7:25 AM  ANarda Rutherford CMA routed this conversation to Me  Me  to DHarshita Bernales      10:08 PM  At this juncture, I think it would be best if we discussed this in person, so I can better understand all your questions and concerns.   Please give the office a call and TDorothea Oglewill will be happy to schedule that for you.    Thanks and see you soon.    Last read by DCoral Ceoat 5:11 AM on 02/12/2016.     Past Medical History  Diagnosis Date  . Hypothyroidism   . Colitis   . Glaucoma     bilateral     Past Surgical History  Procedure Laterality Date  . Wisdom tooth extraction       Family History  Problem Relation Age of Onset  . Colon cancer Paternal Grandmother   . Cancer Paternal Grandmother     colon  .  Cancer Mother      breast  . Hypertension Father   . Asthma Father   . Diabetes Brother      History  Drug Use No  ,  History  Alcohol Use  . 2.4 oz/week  . 4 Glasses of wine per week  ,  History  Smoking status  . Former Smoker  . Quit date: 08/03/2012  Smokeless tobacco  . Never Used  ,  History  Sexual Activity  . Sexual Activity: Yes  . Birth Control/ Protection: Post-menopausal      Current Outpatient Prescriptions on File Prior to Visit  Medication Sig Dispense Refill  . albuterol (PROVENTIL HFA;VENTOLIN HFA) 108 (90 Base) MCG/ACT inhaler Inhale 1-2 puffs into the lungs every 6 (six) hours as needed for wheezing or shortness of breath.    . Calcium Carbonate-Vitamin D (CALCIUM-VITAMIN D) 500-200 MG-UNIT tablet Take 1 tablet by mouth daily.    Marland Kitchen levothyroxine (SYNTHROID, LEVOTHROID) 112 MCG tablet Take 112 mcg by mouth daily before breakfast.     No current facility-administered medications on file prior to visit.    Allergies  Allergen Reactions  . Clindamycin/Lincomycin Swelling  . Codeine     Same as sulfa   . Eggs Or Egg-Derived Products     Abdominal pain some swelling  . Nickel     Blisters   . Penicillins Hives, Nausea And Vomiting and Swelling  . Sulfa Antibiotics Hives and Swelling      Review of Systems:  ( Completed via adult medical history intake form today ) General:  Denies fever, chills, appetite changes, unexplained weight loss.  Respiratory: Denies SOB, DOE, cough, wheezing.  Cardiovascular: Denies chest pain, palpitations.  Gastrointestinal: Denies nausea, vomiting, diarrhea, abdominal pain.  Genitourinary: Denies dysuria, increased frequency, flank pain. Endocrine: Denies hot or cold intolerance, polyuria, polydipsia. Musculoskeletal: Denies myalgias, back pain, joint swelling, arthralgias, gait problems.  Skin: Denies pallor, rash, suspicious lesions.  Neurological: Denies dizziness, seizures, syncope, unexplained weakness, lightheadedness,  numbness and headaches.  Psychiatric/Behavioral: Denies mood changes, suicidal or homicidal ideations, hallucinations, sleep disturbances.    Objective:    Blood pressure 149/94, pulse 91, height '5\' 5"'  (1.651 m), weight 148 lb 1.6 oz (67.178 kg). Body mass index is 24.65 kg/(m^2). General: Well Developed, well nourished, and in no acute distress.  HEENT: Normocephalic, atraumatic, pupils equal round reactive to light, neck supple, No carotid bruits no JVD Skin: Warm and dry, cap RF less 2 sec Cardiac: Regular rate and rhythm, S1, S2 WNL's, no murmurs rubs or gallops Respiratory: ECTA B/L, Not using accessory muscles, speaking in full sentences. NeuroM-Sk: Ambulates w/o assistance, moves ext * 4 w/o difficulty, sensation grossly intact.  Psych: A and O *3, judgement and insight good.       Recent Results (from the past 2160 hour(s))  CBC with Differential/Platelet     Status: None   Collection Time: 02/03/16  8:41 AM  Result Value Ref Range   WBC 9.7 3.8 - 10.8 K/uL   RBC 4.48 3.80 - 5.10 MIL/uL   Hemoglobin 14.7 11.7 - 15.5 g/dL   HCT 44.7 35.0 - 45.0 %   MCV 99.8 80.0 - 100.0 fL   MCH 32.8 27.0 - 33.0 pg   MCHC 32.9 32.0 - 36.0 g/dL   RDW 13.6 11.0 - 15.0 %   Platelets 291 140 - 400 K/uL   MPV 10.2 7.5 - 12.5 fL   Neutro Abs 5917 1500 - 7800 cells/uL   Lymphs Abs 2910  850 - 3900 cells/uL   Monocytes Absolute 582 200 - 950 cells/uL   Eosinophils Absolute 291 15 - 500 cells/uL   Basophils Absolute 0 0 - 200 cells/uL   Neutrophils Relative % 61 %   Lymphocytes Relative 30 %   Monocytes Relative 6 %   Eosinophils Relative 3 %   Basophils Relative 0 %   Smear Review Criteria for review not met     Comment: ** Please note change in unit of measure and reference range(s). **  Comprehensive metabolic panel     Status: None   Collection Time: 02/03/16  8:41 AM  Result Value Ref Range   Sodium 142 135 - 146 mmol/L   Potassium 5.2 3.5 - 5.3 mmol/L   Chloride 106 98 - 110  mmol/L   CO2 24 20 - 31 mmol/L   Glucose, Bld 93 65 - 99 mg/dL   BUN 8 7 - 25 mg/dL   Creat 0.61 0.50 - 1.05 mg/dL    Comment:   For patients > or = 59 years of age: The upper reference limit for Creatinine is approximately 13% higher for people identified as African-American.      Total Bilirubin 0.6 0.2 - 1.2 mg/dL   Alkaline Phosphatase 57 33 - 130 U/L   AST 18 10 - 35 U/L   ALT 13 6 - 29 U/L   Total Protein 6.3 6.1 - 8.1 g/dL   Albumin 4.3 3.6 - 5.1 g/dL   Calcium 10.0 8.6 - 10.4 mg/dL  TSH     Status: Abnormal   Collection Time: 02/03/16  8:41 AM  Result Value Ref Range   TSH 0.11 (L) mIU/L    Comment:   Reference Range   > or = 20 Years  0.40-4.50   Pregnancy Range First trimester  0.26-2.66 Second trimester 0.55-2.73 Third trimester  0.43-2.91     Hemoglobin A1c     Status: None   Collection Time: 02/03/16  8:41 AM  Result Value Ref Range   Hgb A1c MFr Bld 5.2 <5.7 %    Comment:   For the purpose of screening for the presence of diabetes:   <5.7%       Consistent with the absence of diabetes 5.7-6.4 %   Consistent with increased risk for diabetes (prediabetes) >=6.5 %     Consistent with diabetes   This assay result is consistent with a decreased risk of diabetes.   Currently, no consensus exists regarding use of hemoglobin A1c for diagnosis of diabetes in children.   According to American Diabetes Association (ADA) guidelines, hemoglobin A1c <7.0% represents optimal control in non-pregnant diabetic patients. Different metrics may apply to specific patient populations. Standards of Medical Care in Diabetes (ADA).      Mean Plasma Glucose 103 mg/dL  VITAMIN D 25 Hydroxy (Vit-D Deficiency, Fractures)     Status: None   Collection Time: 02/03/16  8:41 AM  Result Value Ref Range   Vit D, 25-Hydroxy 45 30 - 100 ng/mL    Comment: Vitamin D Status           25-OH Vitamin D        Deficiency                <20 ng/mL        Insufficiency         20 - 29  ng/mL        Optimal             >  or = 30 ng/mL   For 25-OH Vitamin D testing on patients on D2-supplementation and patients for whom quantitation of D2 and D3 fractions is required, the QuestAssureD 25-OH VIT D, (D2,D3), LC/MS/MS is recommended: order code [id number] (patients > 2 yrs).   Lipid panel     Status: None   Collection Time: 02/03/16  8:41 AM  Result Value Ref Range   Cholesterol 184 125 - 200 mg/dL   Triglycerides 70 <150 mg/dL   HDL 90 >=46 mg/dL   Total CHOL/HDL Ratio 2.0 <=5.0 Ratio   VLDL 14 <30 mg/dL   LDL Cholesterol 80 <130 mg/dL    Comment:   Total Cholesterol/HDL Ratio:CHD Risk                        Coronary Heart Disease Risk Table                                        Men       Women          1/2 Average Risk              3.4        3.3              Average Risk              5.0        4.4           2X Average Risk              9.6        7.1           3X Average Risk             23.4       11.0 Use the calculated Patient Ratio above and the CHD Risk table  to determine the patient's CHD Risk.         Impression and Recommendations:    The patient was counselled, risk factors were discussed, anticipatory guidance given.   1. Other specified hypothyroidism   2. Prehypertension   3. Former smoker   4. Screening for breast cancer    1) Dec Dose Thyroid meds:  Change in dose of Synthroid from 112 to 100 g daily.   We will recheck her TSH T4 in approx 8 weeks.  If you have any problems before then please let me know.  2) PT's BP at home she states are under good control- always in the 120's/ 70's or less range. She brought monitor in today to compare.  Appears BP is much more elevated in office than home.  Cont to monitor; pt declines meds; counseling done    Please see AVS handed out to patient at the end of our visit for further patient instructions/ counseling done pertaining to today's office visit.  Gross side effects, risk and benefits, and  alternatives of medications discussed with patient.  Patient is aware that all medications have potential side effects and we are unable to predict every sideeffect or drug-drug interaction that may occur.  Expresses verbal understanding and consents to current therapy plan and treatment regiment.  Note: This document was prepared using Dragon voice recognition software and may include unintentional dictation errors.

## 2016-02-21 ENCOUNTER — Encounter: Payer: Self-pay | Admitting: Family Medicine

## 2016-02-23 ENCOUNTER — Encounter: Payer: Self-pay | Admitting: Family Medicine

## 2016-02-23 DIAGNOSIS — Z87891 Personal history of nicotine dependence: Secondary | ICD-10-CM | POA: Insufficient documentation

## 2016-02-23 DIAGNOSIS — R030 Elevated blood-pressure reading, without diagnosis of hypertension: Secondary | ICD-10-CM | POA: Insufficient documentation

## 2016-03-04 ENCOUNTER — Other Ambulatory Visit: Payer: Self-pay | Admitting: Radiology

## 2016-03-04 LAB — HM MAMMOGRAPHY

## 2016-03-09 ENCOUNTER — Encounter: Payer: Self-pay | Admitting: *Deleted

## 2016-03-09 ENCOUNTER — Telehealth: Payer: Self-pay | Admitting: *Deleted

## 2016-03-09 DIAGNOSIS — C50312 Malignant neoplasm of lower-inner quadrant of left female breast: Secondary | ICD-10-CM

## 2016-03-09 HISTORY — DX: Malignant neoplasm of lower-inner quadrant of left female breast: C50.312

## 2016-03-09 NOTE — Telephone Encounter (Signed)
Confirmed [facility] for 03/18/16 at 1215 .  Instructions and contact information given.

## 2016-03-18 ENCOUNTER — Ambulatory Visit
Admission: RE | Admit: 2016-03-18 | Discharge: 2016-03-18 | Disposition: A | Discharge status: Home / Self Care | Payer: BLUE CROSS/BLUE SHIELD | Source: Ambulatory Visit | Attending: Radiation Oncology | Admitting: Radiation Oncology

## 2016-03-18 ENCOUNTER — Other Ambulatory Visit (HOSPITAL_BASED_OUTPATIENT_CLINIC_OR_DEPARTMENT_OTHER): Payer: BLUE CROSS/BLUE SHIELD

## 2016-03-18 ENCOUNTER — Ambulatory Visit (HOSPITAL_BASED_OUTPATIENT_CLINIC_OR_DEPARTMENT_OTHER): Payer: BLUE CROSS/BLUE SHIELD | Admitting: Oncology

## 2016-03-18 ENCOUNTER — Encounter: Payer: Self-pay | Admitting: Oncology

## 2016-03-18 VITALS — BP 164/91 | HR 81 | Temp 97.8°F | Resp 18 | Ht 65.0 in | Wt 145.5 lb

## 2016-03-18 DIAGNOSIS — Z803 Family history of malignant neoplasm of breast: Secondary | ICD-10-CM | POA: Diagnosis not present

## 2016-03-18 DIAGNOSIS — C50312 Malignant neoplasm of lower-inner quadrant of left female breast: Secondary | ICD-10-CM

## 2016-03-18 DIAGNOSIS — Z800 Family history of malignant neoplasm of digestive organs: Secondary | ICD-10-CM

## 2016-03-18 DIAGNOSIS — Z8041 Family history of malignant neoplasm of ovary: Secondary | ICD-10-CM

## 2016-03-18 LAB — COMPREHENSIVE METABOLIC PANEL
ALBUMIN: 4.1 g/dL (ref 3.5–5.0)
ALK PHOS: 62 U/L (ref 40–150)
ALT: 15 U/L (ref 0–55)
ANION GAP: 10 meq/L (ref 3–11)
AST: 20 U/L (ref 5–34)
BILIRUBIN TOTAL: 0.6 mg/dL (ref 0.20–1.20)
BUN: 6.9 mg/dL — AB (ref 7.0–26.0)
CALCIUM: 10.9 mg/dL — AB (ref 8.4–10.4)
CO2: 23 mEq/L (ref 22–29)
CREATININE: 0.8 mg/dL (ref 0.6–1.1)
Chloride: 109 mEq/L (ref 98–109)
EGFR: 86 mL/min/{1.73_m2} — ABNORMAL LOW (ref >90)
Glucose: 112 mg/dl (ref 70–140)
Potassium: 3.8 mEq/L (ref 3.5–5.1)
Sodium: 141 mEq/L (ref 136–145)
Total Protein: 7 g/dL (ref 6.4–8.3)

## 2016-03-18 LAB — CBC WITH DIFFERENTIAL/PLATELET
BASO%: 0.3 % (ref 0.0–2.0)
BASOS ABS: 0 10*3/uL (ref 0.0–0.1)
EOS%: 0.5 % (ref 0.0–7.0)
Eosinophils Absolute: 0.1 10*3/uL (ref 0.0–0.5)
HEMATOCRIT: 44.7 % (ref 34.8–46.6)
HEMOGLOBIN: 15.2 g/dL (ref 11.6–15.9)
LYMPH#: 2.6 10*3/uL (ref 0.9–3.3)
LYMPH%: 26.4 % (ref 14.0–49.7)
MCH: 33.7 pg (ref 25.1–34.0)
MCHC: 34 g/dL (ref 31.5–36.0)
MCV: 99.1 fL (ref 79.5–101.0)
MONO#: 0.7 10*3/uL (ref 0.1–0.9)
MONO%: 7.2 % (ref 0.0–14.0)
NEUT#: 6.5 10*3/uL (ref 1.5–6.5)
NEUT%: 65.6 % (ref 38.4–76.8)
PLATELETS: 231 10*3/uL (ref 145–400)
RBC: 4.51 10*6/uL (ref 3.70–5.45)
RDW: 13.7 % (ref 11.2–14.5)
WBC: 9.9 10*3/uL (ref 3.9–10.3)

## 2016-03-18 MED ORDER — TAMOXIFEN CITRATE 20 MG PO TABS: 20.0000 mg | ORAL_TABLET | Freq: Every day | ORAL | 12 refills | Status: DC

## 2016-03-18 NOTE — Progress Notes (Signed)
Radiation Oncology         [phone] ________________________________  Initial Outpatient Consultation  Name: Laura Landry MRN: [id number]  Date: 03/18/2016  DOB: 1956/07/23  LZ:[id number] Opalski, DO  Rolm Bookbinder, MD   REFERRING PHYSICIAN: Rolm Bookbinder, MD  DIAGNOSIS: The encounter diagnosis was Breast cancer of lower-inner quadrant of left female breast [facility]).   Clinical T2,Nx,Mx grade 2 invasive lobular carcinoma of the left breast (ER positive, PR negative, HER2 negative)  HISTORY OF PRESENT ILLNESS::Laura Landry is a 60 y.o. female who had a screening mammogram on 02/26/16 that detected a mass in the LIQ posterior depth of the left breast. Ultrasound on 03/04/16 revealed a 2 cm mass in the LIQ of the left breast. No abnormal lymph nodes were identified in the left axilla.  Biopsy of the left breast on 03/04/16 revealed grade 2 invasive lobular carcinoma and LCIS (ER 90% positive, PR < 1% negative, HER2 negative, Ki67 30%).  The patient presents today in breast clinic to discuss the role of radiation in the management of her disease.  PREVIOUS RADIATION THERAPY: No  PAST MEDICAL HISTORY:  has a past medical history of Breast cancer of lower-inner quadrant of left female breast ([place]) (03/09/2016); Colitis; Glaucoma; and Hypothyroidism.    PAST SURGICAL HISTORY: Past Surgical History:  Procedure Laterality Date  . WISDOM TOOTH EXTRACTION      FAMILY HISTORY: family history includes Asthma in her father; Cancer in her mother and paternal grandmother; Colon cancer in her paternal grandmother; Diabetes in her brother; Hypertension in her father; Ovarian cancer in her maternal aunt and paternal aunt.  SOCIAL HISTORY:  reports that she quit smoking about 3 years ago. She has never used smokeless tobacco. She reports that she drinks about 2.4 oz of alcohol per week . She reports that she does not use drugs.  ALLERGIES: Clindamycin/lincomycin; Codeine; Eggs or egg-derived  products; Nickel; Penicillins; and Sulfa antibiotics  MEDICATIONS:  Current Outpatient Prescriptions  Medication Sig Dispense Refill  . albuterol (PROVENTIL HFA;VENTOLIN HFA) 108 (90 Base) MCG/ACT inhaler Inhale 1-2 puffs into the lungs every 6 (six) hours as needed for wheezing or shortness of breath.    . Calcium Carbonate-Vitamin D (CALCIUM-VITAMIN D) 500-200 MG-UNIT tablet Take 1 tablet by mouth daily.    Marland Kitchen levothyroxine (SYNTHROID, LEVOTHROID) 100 MCG tablet Take 1 tablet (100 mcg total) by mouth daily. 90 tablet 1  . Multiple Vitamins-Minerals (ALIVE WOMENS 50+ PO) Take by mouth.    . tamoxifen (NOLVADEX) 20 MG tablet Take 1 tablet (20 mg total) by mouth daily. 90 tablet 12   No current facility-administered medications for this encounter.     REVIEW OF SYSTEMS:  A 15 point review of systems is documented in the electronic medical record. This was obtained by the nursing staff. However, I reviewed this with the patient to discuss relevant findings and make appropriate changes.  The patient has night sweats, loss of sleep, fatigue, sinus problems, nose bleeds, abdominal pain, bruise easily, back pain, leg numbness, and thyroid problems.  Gynecologic History  Age at first menstrual period? 15  Are you still having periods? No Approximate date of last period? 2011  If you no longer have periods: Have you used hormone replacement? No Obstetric History:  How many children have you carried to term? 2 Your age at first live birth? 77  Pregnant now or trying to get pregnant? No  Have you used birth control pills or hormone shots for contraception? Yes  If so, for how  long (or approximate dates)? 32 years  Would you be interested in learning more about the options to preserve fertility? No Health Maintenance:  Have you ever had a colonoscopy? Yes If yes, date? 02/21/2014  Have you ever had a bone density? Yes If yes, date? 2013  Date of your last PAP smear? Feb 2016 Date of your FIRST  mammogram? 2008   PHYSICAL EXAM:  Vitals with BMI 03/18/2016  Height '5\' 5"'   Weight 145 lbs 8 oz  BMI 02.7  Systolic 741  Diastolic 91  Pulse 81  Respirations 18   Lungs are clear to auscultation bilaterally. Heart has regular rate and rhythm. No palpable cervical, supraclavicular, or axillary adenopathy. Right breast reveals no palpable mass or nipple discharge. Left breast shows some bruising inferiorly from her biopsy. There is a palpable mass in the LIQ along the areolar border measuring approximately 2.5 x 2 cm. No nipple discharge or bleeding. There is some architectural distortion of the breast from this lesion with the nipple retracted interiorly in the sitting position.  ECOG = 1 LABORATORY DATA:  Lab Results  Component Value Date   WBC 9.9 03/18/2016   HGB 15.2 03/18/2016   HCT 44.7 03/18/2016   MCV 99.1 03/18/2016   PLT 231 03/18/2016   NEUTROABS 6.5 03/18/2016   Lab Results  Component Value Date   NA 141 03/18/2016   K 3.8 03/18/2016   CL 106 02/03/2016   CO2 23 03/18/2016   GLUCOSE 112 03/18/2016   CREATININE 0.8 03/18/2016   CALCIUM 10.9 (H) 03/18/2016      RADIOGRAPHY:  Reports and imaging studies from [facility] reviewed earlier during conference    IMPRESSION: Clinical T2,Nx,Mx grade 2 invasive lobular carcinoma of the left breast (ER positive, PR negative, HER2 negative)  The patient would proceed to Genetics evaluation tomorrow at [place]. She will also be scheduled for a breast MRI in the future in light of her diagnosis of invasive lobular carcinoma. Surgical plans are pending this evaluation. Recommendations for radiation are pending further workup. The patient may potentially be a breast conservation candidate, but her cosmetic results may be sub-optimal given the location and size of the lesion as it relates to her breast size. Dr. Donne Hazel will make this determination at a later date.  PLAN: Genetics evaluation and breast MRI. Then surgical  evaluation.     ------------------------------------------------  [name], PhD, MD  This document serves as a record of services personally performed by Gery Pray, MD. It was created on his behalf by Darcus Austin, a trained medical scribe. The creation of this record is based on the scribe's personal observations and the provider's statements to them. This document has been checked and approved by the attending provider.

## 2016-03-19 ENCOUNTER — Telehealth: Payer: Self-pay | Admitting: *Deleted

## 2016-03-19 ENCOUNTER — Encounter: Payer: BLUE CROSS/BLUE SHIELD | Admitting: Genetic Counselor

## 2016-03-19 ENCOUNTER — Encounter: Payer: Self-pay | Admitting: General Practice

## 2016-03-19 ENCOUNTER — Telehealth: Payer: Self-pay

## 2016-03-19 ENCOUNTER — Other Ambulatory Visit: Payer: BLUE CROSS/BLUE SHIELD

## 2016-03-19 NOTE — Telephone Encounter (Signed)
Received [facility] fax stating pt called to cancel appointments for today.  Upon chart review, these appointments were for Genetic Counseling and lab work in regards to genetics.  Jeanine Luz notified of pt's wish to cancel appointments for today.

## 2016-03-19 NOTE — Telephone Encounter (Signed)
Received voicemail that patient wants to cancel genetic testing and does not want to reschedule.

## 2016-03-19 NOTE — Telephone Encounter (Signed)
Ordered oncotype on core biopsy per Dr. Jana Hakim.  Faxed requisition to pathology and confirmed receipt. Faxed PA to [facility].

## 2016-03-19 NOTE — Progress Notes (Signed)
[facility] Psychosocial Distress Screening Spiritual Care  Met with Raeden and her husband Artie in [facility] to introduce [facility] team/resources, reviewing distress screen per protocol.  The patient scored a 3 on the Psychosocial Distress Thermometer which indicates mild distress. Also assessed for distress and other psychosocial needs.   ONCBCN DISTRESS SCREENING 03/19/2016  Screening Type Initial Screening  Distress experienced in past week (1-10) 3  Emotional problem type Nervousness/Anxiety;Adjusting to illness  Information Concerns Type Lack of info about treatment;Lack of info about complementary therapy choices  Referral to support programs Yes  Other Spiritual Care   Per pt, she is approaching her dx in a matter-of-fact way and is using perspective to cope.  Per couple, they have seen many friends go through difficult and even terminal diagnoses, so they recognize that life is precious and unpredictable.  They try to enjoy as much as they can.  Follow up needed: No.  Couple aware of ongoing [facility] team/programming availability, but please also page if needs arise/circumstances change.  Thank you.   [place], [place], [facility] Pager [phone] Voicemail [phone]

## 2016-03-20 ENCOUNTER — Telehealth: Payer: Self-pay | Admitting: Oncology

## 2016-03-20 ENCOUNTER — Other Ambulatory Visit: Payer: BLUE CROSS/BLUE SHIELD

## 2016-03-20 NOTE — Telephone Encounter (Signed)
04/24/2016  Appointment rescheduled per patient request. Will be out of town until 04/26/2016.

## 2016-03-22 NOTE — Progress Notes (Signed)
[facility]  [phone] [phone]     ID: Laura Landry DOB: 03/17/1956  MR#: [id number]  UYQ#:[id number]  Patient Care Team: Mellody Dance, DO as PCP - General (Family Medicine) Rolm Bookbinder, MD as Consulting Physician (General Surgery) Chauncey Cruel, MD as Consulting Physician (Oncology) Gery Pray, MD as Consulting Physician (Radiation Oncology) OTHER MD:  CHIEF COMPLAINT: Invasive lobular breast cancer  CURRENT TREATMENT: Awaiting definitive surgery   BREAST CANCER HISTORY: Laura Landry had routine screening mammography at [facility] 02/26/2016, with tomosynthesis. This found the breast density to be category C. In the left breast lower inner quadrant there was a spiculated mass. This was further evaluated with left ultrasound 03/04/2016, which confirmed a 2.0 cm irregular mass in the left breast lower inner quadrant posteriorly. The left axilla was sonographically benign.  On 03/04/2016 the patient underwent biopsy of the left breast mass in question, and the pathology ([id number]) found an invasive lobular breast cancer, E-cadherin negative, grade 2, estrogen receptor 90% positive with strong staining intensity, progesterone receptor negative, with an MIB-1 of 30%, and no HER-2 amplification, the signals ratio being 1.43 and the number per cell 2.65.  Her subsequent history is as detailed below.  INTERVAL HISTORY: Laura Landry was evaluated in the multidisciplinary breast cancer clinic 03/18/2016 accompanied by her husband Laura Landry Her case was also presented in the multidisciplinary breast cancer conference that same morning. At that time a preliminary plan was proposed: MRI to start with because this is a lobular breast cancer, Oncotype obtained from the core biopsy, consideration of neoadjuvant anti-estrogens while genetics testing is pending, then definitive surgery and radiation.  REVIEW OF SYSTEMS: There were no specific symptoms leading to the  original mammogram, which was routinely scheduled. The patient denies unusual headaches, visual changes, nausea, vomiting, stiff neck, dizziness, or gait imbalance. There has been no cough, phlegm production, or pleurisy, no chest pain or pressure, and no change in bowel or bladder habits. The patient denies fever, rash, bleeding, unexplained fatigue or unexplained weight loss. She does have a history of night sweats which she attributes to being overdosed with Synthroid, she has minimal epistaxis at times, she complains of easy bruising, and she has occasional back and abdominal pains which are not more intense or persistent than before. A detailed review of systems was otherwise entirely negative.   PAST MEDICAL HISTORY: Past Medical History:  Diagnosis Date  . Breast cancer of lower-inner quadrant of left female breast ([place]) 03/09/2016  . Colitis   . Glaucoma    bilateral  . Hypothyroidism     PAST SURGICAL HISTORY: Past Surgical History:  Procedure Laterality Date  . WISDOM TOOTH EXTRACTION      FAMILY HISTORY Family History  Problem Relation Age of Onset  . Colon cancer Paternal Grandmother   . Cancer Paternal Grandmother     colon  . Cancer Mother     breast  . Hypertension Father   . Asthma Father   . Diabetes Brother   . Ovarian cancer Paternal Aunt   . Ovarian cancer Maternal Aunt   The patient's father is 8 years old as of 29-Mar-2016. The patient's mother died at the age of 54. She had been diagnosed with invasive breast cancer at the age of 4. The patient has one brother, no sisters. A paternal grandmother had colon cancer diagnosed around age 8 and a paternal aunt had ovarian cancer diagnosed around age 29. She was tested for the BRCA gene but was negative. There  is also a maternal aunts diagnosed with ovarian cancer at age 56.  GYNECOLOGIC HISTORY:  No LMP recorded. Patient is postmenopausal. Menarche age 69, first live birth age 16. The patient is GX P2. She  stopped having periods in 2011. She never took hormone replacement. 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 Her husband Laura Landry is retired from [employer] in the [place], [place] as a second Education officer, museum. He still does this part-time. Son Laura Landry this and pleasant Garden works in Engineer, technical sales. Son Laura Landry lives in [place] and is as Chiropractor for a [employer] there. The patient has no grandchildren.    ADVANCED DIRECTIVES: In place   HEALTH MAINTENANCE: Social History  Substance Use Topics  . Smoking status: Former Smoker    Quit date: 08/03/2012  . Smokeless tobacco: Never Used  . Alcohol use 2.4 oz/week    4 Glasses of wine per week     Colonoscopy:2015/[facility]  PAP: February 2016  Bone density: 2013   Allergies  Allergen Reactions  . Clindamycin/Lincomycin Swelling  . Codeine     Same as sulfa   . Eggs Or Egg-Derived Products     Abdominal pain some swelling  . Nickel     Blisters   . Penicillins Hives, Nausea And Vomiting and Swelling  . Sulfa Antibiotics Hives and Swelling    Current Outpatient Prescriptions  Medication Sig Dispense Refill  . albuterol (PROVENTIL HFA;VENTOLIN HFA) 108 (90 Base) MCG/ACT inhaler Inhale 1-2 puffs into the lungs every 6 (six) hours as needed for wheezing or shortness of breath.    . Calcium Carbonate-Vitamin D (CALCIUM-VITAMIN D) 500-200 MG-UNIT tablet Take 1 tablet by mouth daily.    . Multiple Vitamins-Minerals (ALIVE WOMENS 50+ PO) Take by mouth.    . levothyroxine (SYNTHROID, LEVOTHROID) 100 MCG tablet Take 1 tablet (100 mcg total) by mouth daily. 90 tablet 1  . tamoxifen (NOLVADEX) 20 MG tablet Take 1 tablet (20 mg total) by mouth daily. 90 tablet 12   No current facility-administered medications for this visit.     OBJECTIVE: Middle-aged white woman in no acute  distress Vitals:   03/18/16 1254  BP: (!) 164/91  Pulse: 81  Resp: 18  Temp: 97.8 F (36.6 C)     Body mass index is 24.21 kg/m.    ECOG FS:0 - Asymptomatic  Ocular: Sclerae unicteric, pupils equal, round and reactive to light Ear-nose-throat: Oropharynx clear and moist Lymphatic: No cervical or supraclavicular adenopathy Lungs no rales or rhonchi, good excursion bilaterally Heart regular rate and rhythm, no murmur appreciated Abd soft, nontender, positive bowel sounds MSK no focal spinal tenderness, no joint edema Neuro: non-focal, well-oriented, appropriate affect Breasts: The right breast is unremarkable. The left breast has some bruising from the recent biopsy. There is an easily palpable mass in the lower inner quadrant near the nipple. This measures between 2 and 3 cm. I do not palpate any left axillary adenopathy.   LAB RESULTS:  CMP     Component Value Date/Time   NA 141 03/18/2016 1223   K 3.8 03/18/2016 1223   CL 106 02/03/2016 0841   CO2 23 03/18/2016 1223   GLUCOSE 112 03/18/2016 1223   BUN 6.9 (L) 03/18/2016 1223   CREATININE 0.8 03/18/2016 1223   CALCIUM 10.9 (H) 03/18/2016 1223   PROT 7.0 03/18/2016 1223  ALBUMIN 4.1 03/18/2016 1223   AST 20 03/18/2016 1223   ALT 15 03/18/2016 1223   ALKPHOS 62 03/18/2016 1223   BILITOT 0.60 03/18/2016 1223    INo results found for: SPEP, UPEP  Lab Results  Component Value Date   WBC 9.9 03/18/2016   NEUTROABS 6.5 03/18/2016   HGB 15.2 03/18/2016   HCT 44.7 03/18/2016   MCV 99.1 03/18/2016   PLT 231 03/18/2016      Chemistry      Component Value Date/Time   NA 141 03/18/2016 1223   K 3.8 03/18/2016 1223   CL 106 02/03/2016 0841   CO2 23 03/18/2016 1223   BUN 6.9 (L) 03/18/2016 1223   CREATININE 0.8 03/18/2016 1223   GLU 82 05/07/2015      Component Value Date/Time   CALCIUM 10.9 (H) 03/18/2016 1223   ALKPHOS 62 03/18/2016 1223   AST 20 03/18/2016 1223   ALT 15 03/18/2016 1223   BILITOT 0.60  03/18/2016 1223       No results found for: LABCA2  No components found for: LABCA125  No results for input(s): INR in the last 168 hours.  Urinalysis No results found for: COLORURINE, APPEARANCEUR, LABSPEC, PHURINE, GLUCOSEU, HGBUR, BILIRUBINUR, KETONESUR, PROTEINUR, UROBILINOGEN, NITRITE, LEUKOCYTESUR   STUDIES: No results found.  ELIGIBLE FOR AVAILABLE RESEARCH PROTOCOL: no  ASSESSMENT: 60 y.o. [place], [place] woman   PLAN: We spent the better part of today's hour-long appointment discussing the biology of breast cancer in general, and the specifics of the patient's tumor in particular. We first reviewed the fact that cancer is not one disease but more than 100 different diseases and that it is important to keep them separate-- otherwise when friends and relatives discuss their own cancer experiences with Judyann confusion can result. Similarly we explained that if breast cancer spreads to the bone or liver, the patient would not have bone cancer or liver cancer, but breast cancer in the bone and breast cancer in the liver: one cancer in three places-- not 3 different cancers which otherwise would have to be treated in 3 different ways.  We discussed the difference between local and systemic therapy. In terms of loco-regional treatment, lumpectomy plus radiation is equivalent to mastectomy as far as survival is concerned. For this reason, and because the cosmetic results are generally superior, we recommend breast conserving surgery. We also noted that in terms of sequencing of treatments, whether systemic therapy or surgery is done first does not affect the ultimate outcome.  We then discussed the rationale for systemic therapy. There is some risk that this cancer may have already spread to other parts of her body. Patients frequently ask at this point about bone scans, CAT scans and PET scans to find out if they have occult breast cancer somewhere else. The problem is  that in early stage disease we are much more likely to find false positives then true cancers and this would expose the patient to unnecessary procedures as well as unnecessary radiation. Scans cannot answer the question the patient really would like to know, which is whether she has microscopic disease elsewhere in her body. For those reasons we do not recommend them. However she will have a breast MRI since her tumor is lobular disease be difficult to delineate in-stent her mammography.  Next we went over the options for systemic therapy which are anti-estrogens, anti-HER-2 immunotherapy, and chemotherapy. Meigan does not meet criteria for anti-HER-2 immunotherapy. She is a good candidate for anti-estrogens.  The question of chemotherapy is more complicated. Chemotherapy is most effective in rapidly growing, aggressive tumors. It is much less effective in low-grade, slow growing cancers. Shamecca's tumor falls somewhere in between. For that reason we are going to request an Oncotype from the core biopsy sample, as suggested by NCCN guidelines. That will help [place] make a definitive decision regarding chemotherapy in this case.  With a history of ovarian cancer in both sides of the family, Glennis qualifies for genetics testing. This is being arranged.  Finally, we discussed starting anti-estrogens now. There will be some delay in her surgery because we would like to have the genetics results and the Oncotype results prior to the definitive treatment. If she starts anti-estrogens now, she will be able to take as long as needed to make her optimal surgical plans, which may include reconstruction if she is found to have a deleterious mutation and opts for bilateral mastectomy. In addition this will give her some experience of anti-estrogens which she will be taking in any case later on in her treatment.  Accordingly today we discussed the difference between tamoxifen and the aromatase inhibitors. She has a good  understanding of the possible toxicities, side effects and complications of these agents. After much discussion she decided she would like to give tamoxifen a try and this prescription was placed.  Tentatively I have scheduled her to return to see me the third week in September. We should have all the results of her tests by then and can make a definitive decision regarding chemotherapy and her optimal surgical procedure. She knows to call for any problems that may develop before that visit.  Chauncey Cruel, MD   03/22/2016 11:54 AM Medical Oncology and Hematology [facility] [address] [place], [place] [address] Tel. [phone]    Fax. [phone]

## 2016-03-25 ENCOUNTER — Telehealth: Payer: Self-pay | Admitting: *Deleted

## 2016-03-25 NOTE — Telephone Encounter (Signed)
Spoke to pt concerning [facility] from 03/18/16. Denies questions or concerns regarding dx or treatment care plan. Encourage pt to call with needs. Received verbal understanding. Contact information provided.

## 2016-03-27 ENCOUNTER — Ambulatory Visit
Admission: RE | Admit: 2016-03-27 | Discharge: 2016-03-27 | Disposition: A | Discharge status: Home / Self Care | Payer: BLUE CROSS/BLUE SHIELD | Source: Ambulatory Visit | Attending: General Surgery | Admitting: General Surgery

## 2016-03-27 DIAGNOSIS — C50312 Malignant neoplasm of lower-inner quadrant of left female breast: Secondary | ICD-10-CM

## 2016-03-27 MED ORDER — GADOBENATE DIMEGLUMINE 529 MG/ML IV SOLN
13.0000 mL | Freq: Once | INTRAVENOUS | Status: AC | PRN
  Administered 2016-03-27: 13 mL via INTRAVENOUS

## 2016-03-30 DIAGNOSIS — C50312 Malignant neoplasm of lower-inner quadrant of left female breast: Secondary | ICD-10-CM | POA: Insufficient documentation

## 2016-04-01 ENCOUNTER — Telehealth: Payer: Self-pay | Admitting: Genetic Counselor

## 2016-04-01 ENCOUNTER — Encounter: Payer: Self-pay | Admitting: Genetic Counselor

## 2016-04-01 NOTE — Telephone Encounter (Signed)
Pt cld to reschedule appt from 8/17. Appt scheduled for 10/5 w/Kayla Boggs. Letter mailed to the pt.

## 2016-04-01 NOTE — Telephone Encounter (Signed)
Laura Landry had called and had reached me, she was trying to change her appt.  I gave her a call back.  She still has concerns about the cost of genetic testing.  She says $100 is fine, but she does not want to pay anything more than that.  I offered to get a benefits investigation running for her beforehand.  She agrees with this, so I got some family information and will send a copy of her insurance to [employer] to run that BI.  Let her know it would take about 3 days.  I have re-scheduled her appt for Tuesday, October 10th at 10 AM with lab afterward.  Let her know we can definitely cancel this session, if testing is going to be too expensive.  She tells me results will not affect her surgical decisions, so it is okay that we wait to see her until October.  She is welcome to call with any additional questions.  Otherwise, I will call her as soon as I know more about the BI results.

## 2016-04-02 ENCOUNTER — Other Ambulatory Visit: Payer: Self-pay | Admitting: General Surgery

## 2016-04-02 DIAGNOSIS — C50912 Malignant neoplasm of unspecified site of left female breast: Secondary | ICD-10-CM

## 2016-04-07 ENCOUNTER — Other Ambulatory Visit: Payer: BLUE CROSS/BLUE SHIELD

## 2016-04-08 ENCOUNTER — Telehealth: Payer: Self-pay | Admitting: *Deleted

## 2016-04-08 NOTE — Telephone Encounter (Signed)
Left message for a return phone call to schedule an appointment with Dr. Jana Hakim to discuss oncotype results.

## 2016-04-08 NOTE — H&P (Signed)
  Subjective:    Patient ID: Laura Landry is a 60 y.o. female.  HPI  Here for follow up discussion breast reconstruction. Presented following screening MMG with mass noted left breast LIQ. [place] confirmed a 2.0 cm irregular mass in the left breast LIQ with left axilla sonographically benign. Biopsy found an ILC, ER+/PR- Her2 -. MRI with 2.6 x 1.8 x 2.7 cm spiculated enhancing mass abutting muscle and rib at  6-7 o'clock. She has discussed NSM with Dr. Donne Hazel.  Oncotype pending. Patient declined genetic counseling as she reports her insurance stated the lab work was not preauthorized- this has been rescheduled as patient understands she will be informed of out of pocket cost prior to testing. Neoadjuvant tamoxifen prescribed.  Current 34 A/B cup, desires larger. Wt stable.  Review of Systems     Objective:   Physical Exam  Cardiovascular: Normal rate and regular rhythm.   Pulmonary/Chest: Effort normal and breath sounds normal.  Abdominal:  Minimal soft tissue redundancy  Lymphadenopathy:    She has no axillary adenopathy.  Skin:  Fitzpatrick 2 tan    No ptosis bilat GU: palpable mass left breast lower quadrant non mobile with some retraction overlying soft tissue   SN to nipple R 21 L 22 BW R 14 L 14 cm Nipple to IMF R 5 L 6   Assessment:     Left breast ILC  Family history breast ca    Plan:     Plan NSM with TE, possible acellular dermis reconstruction. Discussed use of ADM in reconstruction, cadaveric source. Discussed process of expansion and implant based risks including rupture, MRI surveillance for silicone implants, infection requiring surgery or removal, contracture. Discussed asymmetry one can expect with implant unilateral and natural breast on opposite . Reviewed timing of future surgery dependent on adjuvant treatments.  Reviewed NSM will be asensate and not stimulate. Reviewed risks mastectomy flap necrosis requiring additional surgery.  Irene Limbo, MD [facility] [phone], pin [phone]

## 2016-04-09 ENCOUNTER — Telehealth: Payer: Self-pay | Admitting: *Deleted

## 2016-04-09 NOTE — Telephone Encounter (Signed)
Spoke with patient and confirmed appointment with Dr. Jana Hakim for 04/10/16 at 430 per Dr. Jana Hakim to discuss oncotype results of 26.

## 2016-04-10 ENCOUNTER — Ambulatory Visit (HOSPITAL_BASED_OUTPATIENT_CLINIC_OR_DEPARTMENT_OTHER): Payer: BLUE CROSS/BLUE SHIELD | Admitting: Oncology

## 2016-04-10 ENCOUNTER — Other Ambulatory Visit: Payer: BLUE CROSS/BLUE SHIELD

## 2016-04-10 VITALS — BP 150/92 | HR 86 | Temp 97.9°F | Resp 18 | Ht 65.0 in | Wt 146.9 lb

## 2016-04-10 DIAGNOSIS — Z170 Estrogen receptor positive status [ER+]: Secondary | ICD-10-CM

## 2016-04-10 DIAGNOSIS — Z8041 Family history of malignant neoplasm of ovary: Secondary | ICD-10-CM | POA: Diagnosis not present

## 2016-04-10 DIAGNOSIS — Z800 Family history of malignant neoplasm of digestive organs: Secondary | ICD-10-CM

## 2016-04-10 DIAGNOSIS — C50312 Malignant neoplasm of lower-inner quadrant of left female breast: Secondary | ICD-10-CM | POA: Diagnosis not present

## 2016-04-10 DIAGNOSIS — Z803 Family history of malignant neoplasm of breast: Secondary | ICD-10-CM

## 2016-04-10 MED ORDER — TAMOXIFEN CITRATE 10 MG PO TABS: 10.0000 mg | ORAL_TABLET | Freq: Two times a day (BID) | ORAL | 4 refills | Status: DC

## 2016-04-10 NOTE — Progress Notes (Signed)
[facility]  Telephone:([phone] Fax:([phone]) [phone]     ID: Laura Landry DOB: Apr 10, 1956  MR#: [id number]  IRJ#:[id number]  Patient Care Team: Mellody Dance, DO as PCP - General (Family Medicine) Rolm Bookbinder, MD as Consulting Physician (General Surgery) Chauncey Cruel, MD as Consulting Physician (Oncology) Gery Pray, MD as Consulting Physician (Radiation Oncology) Irene Limbo, MD as Consulting Physician (Plastic Surgery) OTHER MD:  CHIEF COMPLAINT: Invasive lobular breast cancer  CURRENT TREATMENT: Awaiting definitive surgery, to be followed by tamoxifen   BREAST CANCER HISTORY: From the original intake note:  [name] had routine screening mammography at [facility] 02/26/2016, with tomosynthesis. This found the breast density to be category C. In the left breast lower inner quadrant there was a spiculated mass. This was further evaluated with left ultrasound 03/04/2016, which confirmed a 2.0 cm irregular mass in the left breast lower inner quadrant posteriorly. The left axilla was sonographically benign.  On 03/04/2016 the patient underwent biopsy of the left breast mass in question, and the pathology ([id number]) found an invasive lobular breast cancer, E-cadherin negative, grade 2, estrogen receptor 90% positive with strong staining intensity, progesterone receptor negative, with an MIB-1 of 30%, and no HER-2 amplification, the signals ratio being 1.43 and the number per cell 2.65.  Her subsequent history is as detailed below.  INTERVAL HISTORY: Laura Landry returns today for follow-up of her lobular breast cancer accompanied by her husband Laura Landry. Since her last visit here she had a breast MRI 03/27/2016 which confirmed a 2.7 cm spiculated mass abutting subjacent rib and muscle. There is no abnormal signal within the adjacent rib to suggest rib invasion. However invasion of the superficial aspect of the muscle/fascia was not excluded. There were no  abnormal-appearing nodes.   She is considering a nipple sparing mastectomy with reconstruction and has already met with plastics as well as general surgery.  She is here today to review results of her Oncotype, obtained from her biopsy, which showed a recurrence score of 26, predicting a risk of recurrence outside the breast of 17% if the patient's only systemic therapy is tamoxifen for 5 years. This generally indicates a need for chemotherapy.  REVIEW OF SYSTEMS: Laura Landry went to [facility] for a three-week "vacation from cancer" and while they're walking in the [address] she felt very tired and almost fainted. She was struck by the fact that she was not sweating. She says her ankles swell. She wonders if this was due to tamoxifen. She has a swollen gland in the right submandibular area and she wonders if that is due to tamoxifen. She is having some hot flashes. She is not having vaginal wetness. She sleeps poorly. She describes herself is moderately fatigued. She has sinus problems. She has had diarrhea now for several days. She gets occasional headaches. She feels anxious, but not depressed. She states she has a major thyroid problem and has been reading about this in the Internet as Synthroid causing breast cancer. She does not exercise regularly but is very active in home chores. A detailed review of systems today was otherwise stable  PAST MEDICAL HISTORY: Past Medical History:  Diagnosis Date  . Breast cancer of lower-inner quadrant of left female breast ([place]) 03/09/2016  . Colitis   . Glaucoma    bilateral  . Hypothyroidism     PAST SURGICAL HISTORY: Past Surgical History:  Procedure Laterality Date  . WISDOM TOOTH EXTRACTION      FAMILY HISTORY Family History  Problem Relation Age of Onset  .  Colon cancer Paternal Grandmother   . Cancer Paternal Grandmother     colon  . Cancer Mother     breast  . Hypertension Father   . Asthma Father   . Diabetes Brother   . Ovarian cancer  Paternal Aunt   . Ovarian cancer Maternal Aunt   The patient's father is 42 years old as of Mar 07, 2016. The patient's mother died at the age of 22. She had been diagnosed with invasive breast cancer at the age of 59. The patient has one brother, no sisters. A paternal grandmother had colon cancer diagnosed around age 40 and a paternal aunt had ovarian cancer diagnosed around age 41. She was tested for the BRCA gene but was negative. There is also a maternal aunts diagnosed with ovarian cancer at age 45.  GYNECOLOGIC HISTORY:  No LMP recorded. Patient is postmenopausal. Menarche age 69, first live birth age 70. The patient is GX P2. She stopped having periods in 2011. She never took hormone replacement. 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 Her husband Laura Landry is retired from [employer] in the [place] [place] as a second Education officer, museum. He still does this part-time. Son Laura Landry this and [place] works in Engineer, technical sales. Son Laura Landry lives in [place] and is as Chiropractor for a [employer] there. The patient has no grandchildren.    ADVANCED DIRECTIVES: In place   HEALTH MAINTENANCE: Social History  Substance Use Topics  . Smoking status: Former Smoker    Quit date: 08/03/2012  . Smokeless tobacco: Never Used  . Alcohol use 2.4 oz/week    4 Glasses of wine per week     Colonoscopy:2015/[facility]  PAP: February 2016  Bone density: 2013   Allergies  Allergen Reactions  . Clindamycin/Lincomycin Swelling    Throat swells  . Codeine Hives and Swelling  . Eggs Or Egg-Derived Products     Abdominal pain some swelling  . Nickel     Blisters   . Sulfa Antibiotics Hives and Swelling  . Penicillins Hives, Nausea And Vomiting, Swelling and Rash    Has patient had a PCN reaction causing immediate rash, facial/tongue/throat  swelling, SOB or lightheadedness with hypotension: Yes Has patient had a PCN reaction causing severe rash involving mucus membranes or skin necrosis: No Has patient had a PCN reaction that required hospitalization No Has patient had a PCN reaction occurring within the last 10 years: No If all of the above answers are "NO", then may proceed with Cephalosporin use.    Current Outpatient Prescriptions  Medication Sig Dispense Refill  . albuterol (PROVENTIL HFA;VENTOLIN HFA) 108 (90 Base) MCG/ACT inhaler Inhale 1-2 puffs into the lungs every 6 (six) hours as needed for wheezing or shortness of breath.    . Calcium Carbonate-Vitamin D (CALCIUM-VITAMIN D) 500-200 MG-UNIT tablet Take 2 tablets by mouth daily.     . hydrocortisone cream 1 % Apply 1 application topically 2 (two) times daily as needed (nash).    Marland Kitchen ibuprofen (ADVIL,MOTRIN) 200 MG tablet Take 200 mg by mouth every 6 (six) hours as needed for mild pain.    Marland Kitchen levothyroxine (SYNTHROID, LEVOTHROID) 100 MCG tablet Take 1 tablet (100 mcg total) by mouth daily. 90 tablet 1  . Multiple Vitamins-Minerals (ALIVE WOMENS 50+ PO) Take 1 tablet by mouth daily.     . naphazoline-pheniramine (NAPHCON-A)  0.025-0.3 % ophthalmic solution Place 1 drop into both eyes 4 (four) times daily as needed for irritation or allergies.    Marland Kitchen SALINE NASAL MIST NA Place 1 spray into the nose daily as needed (nasal congestion).    . tamoxifen (NOLVADEX) 10 MG tablet Take 1 tablet (10 mg total) by mouth 2 (two) times daily. 180 tablet 4   No current facility-administered medications for this visit.     OBJECTIVE: Middle-aged white woman Who appears well Vitals:   04/10/16 1609  BP: (!) 150/92  Pulse: 86  Resp: 18  Temp: 97.9 F (36.6 C)     Body mass index is 24.45 kg/m.    ECOG FS:0 - Asymptomatic  Sclerae unicteric, EOMs intact Oropharynx clear and moist No cervical or supraclavicular adenopathy Lungs no rales or rhonchi Heart regular rate and rhythm Abd  soft, nontender, positive bowel sounds MSK no focal spinal tenderness, no upper extremity lymphedema Neuro: nonfocal, well oriented, appropriate affect Breasts: The right breast is unremarkable. The left breast is status post recent biopsy. Left axilla is benign.   LAB RESULTS:  CMP     Component Value Date/Time   NA 141 03/18/2016 1223   K 3.8 03/18/2016 1223   CL 106 02/03/2016 0841   CO2 23 03/18/2016 1223   GLUCOSE 112 03/18/2016 1223   BUN 6.9 (L) 03/18/2016 1223   CREATININE 0.8 03/18/2016 1223   CALCIUM 10.9 (H) 03/18/2016 1223   PROT 7.0 03/18/2016 1223   ALBUMIN 4.1 03/18/2016 1223   AST 20 03/18/2016 1223   ALT 15 03/18/2016 1223   ALKPHOS 62 03/18/2016 1223   BILITOT 0.60 03/18/2016 1223    INo results found for: SPEP, UPEP  Lab Results  Component Value Date   WBC 9.9 03/18/2016   NEUTROABS 6.5 03/18/2016   HGB 15.2 03/18/2016   HCT 44.7 03/18/2016   MCV 99.1 03/18/2016   PLT 231 03/18/2016      Chemistry      Component Value Date/Time   NA 141 03/18/2016 1223   K 3.8 03/18/2016 1223   CL 106 02/03/2016 0841   CO2 23 03/18/2016 1223   BUN 6.9 (L) 03/18/2016 1223   CREATININE 0.8 03/18/2016 1223   GLU 82 05/07/2015      Component Value Date/Time   CALCIUM 10.9 (H) 03/18/2016 1223   ALKPHOS 62 03/18/2016 1223   AST 20 03/18/2016 1223   ALT 15 03/18/2016 1223   BILITOT 0.60 03/18/2016 1223       No results found for: LABCA2  No components found for: LABCA125  No results for input(s): INR in the last 168 hours.  Urinalysis No results found for: COLORURINE, APPEARANCEUR, LABSPEC, PHURINE, GLUCOSEU, HGBUR, BILIRUBINUR, KETONESUR, PROTEINUR, UROBILINOGEN, NITRITE, LEUKOCYTESUR   STUDIES: Mr Breast Bilateral W Wo Contrast  Result Date: 03/27/2016 CLINICAL DATA:  Recent diagnosis of left breast cancer. LABS:  Does not apply EXAM: BILATERAL BREAST MRI WITH AND WITHOUT CONTRAST TECHNIQUE: Multiplanar, multisequence MR images of both breasts  were obtained prior to and following the intravenous administration of 13 ml of MultiHance. THREE-DIMENSIONAL MR IMAGE RENDERING ON INDEPENDENT WORKSTATION: Three-dimensional MR images were rendered by post-processing of the original MR data on an independent workstation. The three-dimensional MR images were interpreted, and findings are reported in the following complete MRI report for this study. Three dimensional images were evaluated at the independent DynaCad workstation COMPARISON:  Previous exam(s). FINDINGS: Breast composition: c. Heterogeneous fibroglandular tissue. Background parenchymal enhancement: Mild. Right breast: No mass  or abnormal enhancement. Left breast: There is a 2.6 x 1.8 x 2.7 cm spiculated enhancing mass with associated biopsy clip demonstrating washout enhancement kinetics at the posterior depth left breast 6-7 o'clock. The mass abuts the subjacent rib and muscle. There is no abnormal signal within the adjacent rib to suggest invasion in the adjacent rib. Invasion to the superficial aspect of the subjacent muscle/facia is not excluded. Lymph nodes: No abnormal appearing lymph nodes. Ancillary findings:  None. IMPRESSION: Mass at the posterior left breast 6-7 o'clock correlating to the patient's known recent biopsy cancer. The mass abuts the subjacent rib and muscle. There is no abnormal signal within the adjacent rib to suggest invasion in the rib. Invasion to the superficial aspect of the subjacent muscle/facia is not excluded. RECOMMENDATION: Treatment plan BI-RADS CATEGORY  6: Known biopsy-proven malignancy. Electronically Signed   By: Abelardo Diesel M.D.   On: 03/27/2016 15:01    ELIGIBLE FOR AVAILABLE RESEARCH PROTOCOL: no  ASSESSMENT: 60 y.o. [place], [place] woman status post left breast lower inner quadrant biopsy 03/04/2016 for a clinical T2 N0, stage IIa invasive lobular carcinoma, estrogen receptor positive, progesterone receptor negative, with an MIB-1 of 30%  and no HER-2 amplification.  (1) neoadjuvant tamoxifen started 03/18/2016  (2) Oncotype DX obtained from the 03/04/2016 biopsy shows a score of 26, predicting a 10 year risk of recurrence outside the breast of 17% if the patient's only systemic therapy is tamoxifen for 5 years.  (3) genetics testing scheduled for 05/12/2016 if itc can be financially approved  PLAN: I spent approximately 40 minutes with Laura Landry and her husband reviewing her situation in detail. We also looked at the MRI images. She understands that in terms of systemic therapy her choice is anti-estrogens like tamoxifen and chemotherapy and that we recommended both.  This is based on her Oncotype, which suggests that if she had both chemotherapy and tamoxifen are risk of recurrence outside the breast within 10 years would be approximately 12%. If she did only tamoxifen the risk would be 17%.  We also discussed the specific type of chemotherapy she would receive, which would be Cytoxan and Taxotere every 3 weeks 4. We discussed the possible toxicities, side effects and complications of these agents.  She wanted to know what would happen if she did need a tamoxifen nor chemotherapy. I will order a 34% risk of recurrence within 10 years outside the breast in that case.  She understands that what we are talking about is not in breast recurrence but recurrence to the lung liver bones, which would be incurable.  We also discussed the fact that receiving systemic chemotherapy before surgery or after surgery does not affect survival benefits to her of considering systemic treatment for surgery would be that it might make the surgery significantly easier since the tumor in her case appears to be very close to the wall.  After this discussion. She is very clear first of all that she does not want chemotherapy for the 5% benefit that his predicted. She would not want that even for a much greater benefit. Secondly she is not willing to wait  several months to see if tamoxifen shrinks the tumor. In fact she is already scheduled for nipple sparing mastectomy with immediate reconstruction next week.  Accordingly the plan will be for mastectomy and then assuming no surprises tamoxifen for at least 5 years.  She thinks the fainting spell she had in [place] may be due to tamoxifen, and a swollen gland  in her right submandible area may be due to tamoxifen. She really would like to take 10 mg of tamoxifen instead of 20.  I suggested she not take tamoxifen for the next week, so she does not associated with any postoperative problems. She can resume tamoxifen at 10 mg daily September 18 are so, and then see me again in October as planned and if she is tolerating it well at that dose we will try to go to either 10 mg twice daily or 20 mg twice daily as tolerated.  Laura Landry also feels that the fact that she has been overtreated, she feels, for hypothyroidism, may be the cause of her breast cancer and therefore if she switches to Armour Thyroid she may prevents breast cancer. I discussed the difference between correlation and consideration and she is jumping to the assumption that even if there is a causative between Synthroid and breast cancer, which is not clear to me, that doesn't mean that changing from Synthroid to another agent would reduce her risk of this particular cancer coming back. She is going to be discussing this with her primary care physician, she tells me, in the next few days.  Laura Landry will see me again in October as noted. She knows to call for any problems that may develop before her next visit here   Chauncey Cruel, MD   04/10/2016 5:35 PM Medical Oncology and Hematology [facility] [place], [place] [address] Tel. [phone]    Fax. [phone]

## 2016-04-10 NOTE — Pre-Procedure Instructions (Signed)
Laura Landry  04/10/2016      [facility] - [place] - [address] [address] [place] [address] Phone: [phone] Fax: [phone]  [facility] [address], [place] - [place] [address] Phone: [phone] Fax: [phone]     Your procedure is scheduled on Tuesday, April 14, 2016 .  Report to [facility] Admitting at 9:00 A.M.   Call this number if you have problems the morning of surgery:  [phone]   Remember:  Do not eat food or drink liquids after midnight Monday, April 13, 2016   Take these medicines the morning of surgery with A SIP OF WATER : tamoxifen (NOLVADEX), levothyroxine (SYNTHROID),  if needed:SALINE NASAL MIST for nasal congestion, naphazoline-pheniramine (NAPHCON-A)  for  Irritation or allergies, albuterol (PROVENTIL HFA;VENTOLIN HFA) inhaler for wheezing or shortness of breath ( bring inhaler in with you on day of surgery).  Stop taking Aspirin, vitamins, fish oil and herbal medications. Do not take any NSAIDs ie: Ibuprofen, Advil, Naproxen  BC and Goody Powder or any medication containing Aspirin; stop now.               Do not wear jewelry, make-up or nail polish.     Do not wear lotions, powders, or perfumes, or deoderant.  Do not shave 48 hours prior to surgery.   Do not bring valuables to the hospital.  [facility] is not responsible for any belongings or valuables.  Contacts, dentures or bridgework may not be worn into surgery.  Leave your suitcase in the car.  After surgery it may be brought to your room.  For patients admitted to the hospital, discharge time will be determined by your treatment team.  Patients discharged the day of surgery will not be allowed to drive home.     Special instructions: [name] - Preparing for Surgery  Before surgery, you can play an important role.  Because skin is not  sterile, your skin needs to be as free of germs as possible.  You can reduce the number of germs on you skin by washing with CHG (chlorahexidine gluconate) soap before surgery.  CHG is an antiseptic cleaner which kills germs and bonds with the skin to continue killing germs even after washing.  Please DO NOT use if you have an allergy to CHG or antibacterial soaps.  If your skin becomes reddened/irritated stop using the CHG and inform your nurse when you arrive at Short Stay.  Do not shave (including legs and underarms) for at least 48 hours prior to the first CHG shower.  You may shave your face.  Please follow these instructions carefully:   1.  Shower with CHG Soap the night before surgery and the morning of Surgery.  2.  If you choose to wash your hair, wash your hair first as usual with your normal shampoo.  3.  After you shampoo, rinse your hair and body thoroughly to remove the Shampoo.  4.  Use CHG as you would any other liquid soap.  You can apply chg directly  to the skin and wash gently with scrungie or a clean washcloth.  5.  Apply the CHG Soap to your body ONLY FROM THE NECK DOWN.  Do not use on open wounds or open sores.  Avoid contact with your eyes, ears, mouth and genitals (private parts).  Wash genitals (private parts) with your normal  soap.  6.  Wash thoroughly, paying special attention to the area where your surgery will be performed.  7.  Thoroughly rinse your body with warm water from the neck down.  8.  DO NOT shower/wash with your normal soap after using and rinsing off the CHG Soap.  9.  Pat yourself dry with a clean towel.            10.  Wear clean pajamas.            11.  Place clean sheets on your bed the night of your first shower and do not sleep with pets.  Day of Surgery  Do not apply any lotions/deodorants the morning of surgery.  Please wear clean clothes to the hospital/surgery center.  Please read over the following fact sheets that you were given. Pain  Booklet, Coughing and Deep Breathing and Surgical Site Infection Prevention

## 2016-04-13 ENCOUNTER — Encounter (HOSPITAL_COMMUNITY)
Admission: RE | Admit: 2016-04-13 | Discharge: 2016-04-13 | Disposition: A | Discharge status: Home / Self Care | Payer: BLUE CROSS/BLUE SHIELD | Source: Ambulatory Visit | Attending: General Surgery | Admitting: General Surgery

## 2016-04-13 ENCOUNTER — Encounter (HOSPITAL_COMMUNITY): Payer: Self-pay

## 2016-04-13 ENCOUNTER — Telehealth: Payer: Self-pay | Admitting: *Deleted

## 2016-04-13 ENCOUNTER — Telehealth: Payer: Self-pay | Admitting: Genetic Counselor

## 2016-04-13 DIAGNOSIS — E039 Hypothyroidism, unspecified: Secondary | ICD-10-CM | POA: Diagnosis not present

## 2016-04-13 DIAGNOSIS — Z170 Estrogen receptor positive status [ER+]: Secondary | ICD-10-CM | POA: Diagnosis not present

## 2016-04-13 DIAGNOSIS — Z87891 Personal history of nicotine dependence: Secondary | ICD-10-CM | POA: Diagnosis not present

## 2016-04-13 DIAGNOSIS — Z803 Family history of malignant neoplasm of breast: Secondary | ICD-10-CM | POA: Diagnosis not present

## 2016-04-13 DIAGNOSIS — J45909 Unspecified asthma, uncomplicated: Secondary | ICD-10-CM | POA: Diagnosis not present

## 2016-04-13 DIAGNOSIS — C50312 Malignant neoplasm of lower-inner quadrant of left female breast: Secondary | ICD-10-CM | POA: Diagnosis not present

## 2016-04-13 DIAGNOSIS — Z79899 Other long term (current) drug therapy: Secondary | ICD-10-CM | POA: Diagnosis not present

## 2016-04-13 DIAGNOSIS — C50912 Malignant neoplasm of unspecified site of left female breast: Secondary | ICD-10-CM | POA: Diagnosis present

## 2016-04-13 HISTORY — DX: Unspecified asthma, uncomplicated: J45.909

## 2016-04-13 HISTORY — DX: Anemia, unspecified: D64.9

## 2016-04-13 HISTORY — DX: Other fatigue: R53.83

## 2016-04-13 HISTORY — DX: Anxiety disorder, unspecified: F41.9

## 2016-04-13 LAB — BASIC METABOLIC PANEL
Anion gap: 10 (ref 5–15)
BUN: 7 mg/dL (ref 6–20)
CALCIUM: 10 mg/dL (ref 8.9–10.3)
CO2: 23 mmol/L (ref 22–32)
CREATININE: 0.74 mg/dL (ref 0.44–1.00)
Chloride: 107 mmol/L (ref 101–111)
GFR calc Af Amer: >60 mL/min (ref >60)
Glucose, Bld: 97 mg/dL (ref 65–99)
POTASSIUM: 4.1 mmol/L (ref 3.5–5.1)
SODIUM: 140 mmol/L (ref 135–145)

## 2016-04-13 LAB — CBC
HCT: 45.4 % (ref 36.0–46.0)
Hemoglobin: 14.9 g/dL (ref 12.0–15.0)
MCH: 33 pg (ref 26.0–34.0)
MCHC: 32.8 g/dL (ref 30.0–36.0)
MCV: 100.4 fL — ABNORMAL HIGH (ref 78.0–100.0)
PLATELETS: 215 10*3/uL (ref 150–400)
RBC: 4.52 MIL/uL (ref 3.87–5.11)
RDW: 13.6 % (ref 11.5–15.5)
WBC: 9.7 10*3/uL (ref 4.0–10.5)

## 2016-04-13 MED ORDER — VANCOMYCIN HCL IN DEXTROSE 1-5 GM/200ML-% IV SOLN
1000.0000 mg | INTRAVENOUS | Status: AC
  Administered 2016-04-14: 1000 mg via INTRAVENOUS
  Filled 2016-04-13: qty 200

## 2016-04-13 MED ORDER — GABAPENTIN 300 MG PO CAPS
300.0000 mg | ORAL_CAPSULE | ORAL | Status: AC
  Administered 2016-04-14: 300 mg via ORAL
  Filled 2016-04-13: qty 1

## 2016-04-13 MED ORDER — ACETAMINOPHEN 500 MG PO TABS
1000.0000 mg | ORAL_TABLET | ORAL | Status: AC
  Administered 2016-04-14: 1000 mg via ORAL
  Filled 2016-04-13: qty 2

## 2016-04-13 NOTE — Telephone Encounter (Signed)
Called Laura Landry to let her know that I do not yet have results of her benefits investigation.  I will be out the rest of the week, so hopefully those come in soon.  I will be back in the office on 9/18 and hopefully I will have news to give her then.  She is also wanting to change her appt from 10/10 to 10/24, due to having lots of appts that week.  I have made this change.

## 2016-04-13 NOTE — Telephone Encounter (Signed)
Received Oncotype Dx results of 26/17%.  Handed Dr. Jana Hakim a copy and took a copy to HIM to scan.

## 2016-04-14 ENCOUNTER — Ambulatory Visit (HOSPITAL_COMMUNITY): Payer: BLUE CROSS/BLUE SHIELD | Admitting: Certified Registered Nurse Anesthetist

## 2016-04-14 ENCOUNTER — Encounter (HOSPITAL_COMMUNITY)
Admission: RE | Disposition: A | Discharge status: Home / Self Care | Payer: Self-pay | Source: Ambulatory Visit | Attending: General Surgery

## 2016-04-14 ENCOUNTER — Observation Stay (HOSPITAL_COMMUNITY)
Admission: RE | Admit: 2016-04-14 | Discharge: 2016-04-15 | Disposition: A | Discharge status: Home / Self Care | Payer: BLUE CROSS/BLUE SHIELD | Source: Ambulatory Visit | Attending: General Surgery | Admitting: General Surgery

## 2016-04-14 ENCOUNTER — Ambulatory Visit: Payer: BLUE CROSS/BLUE SHIELD | Admitting: Family Medicine

## 2016-04-14 ENCOUNTER — Encounter (HOSPITAL_COMMUNITY): Payer: Self-pay | Admitting: Certified Registered Nurse Anesthetist

## 2016-04-14 ENCOUNTER — Encounter (HOSPITAL_COMMUNITY)
Admission: RE | Admit: 2016-04-14 | Discharge: 2016-04-14 | Disposition: A | Discharge status: Home / Self Care | Payer: BLUE CROSS/BLUE SHIELD | Source: Ambulatory Visit | Attending: General Surgery | Admitting: General Surgery

## 2016-04-14 DIAGNOSIS — Z87891 Personal history of nicotine dependence: Secondary | ICD-10-CM | POA: Insufficient documentation

## 2016-04-14 DIAGNOSIS — C50919 Malignant neoplasm of unspecified site of unspecified female breast: Secondary | ICD-10-CM | POA: Diagnosis present

## 2016-04-14 DIAGNOSIS — J45909 Unspecified asthma, uncomplicated: Secondary | ICD-10-CM | POA: Insufficient documentation

## 2016-04-14 DIAGNOSIS — Z803 Family history of malignant neoplasm of breast: Secondary | ICD-10-CM | POA: Insufficient documentation

## 2016-04-14 DIAGNOSIS — E039 Hypothyroidism, unspecified: Secondary | ICD-10-CM

## 2016-04-14 DIAGNOSIS — Z79899 Other long term (current) drug therapy: Secondary | ICD-10-CM | POA: Insufficient documentation

## 2016-04-14 DIAGNOSIS — C50912 Malignant neoplasm of unspecified site of left female breast: Secondary | ICD-10-CM

## 2016-04-14 DIAGNOSIS — Z170 Estrogen receptor positive status [ER+]: Secondary | ICD-10-CM

## 2016-04-14 DIAGNOSIS — C50312 Malignant neoplasm of lower-inner quadrant of left female breast: Secondary | ICD-10-CM | POA: Diagnosis not present

## 2016-04-14 HISTORY — PX: BREAST SURGERY: SHX581

## 2016-04-14 HISTORY — PX: BREAST RECONSTRUCTION WITH PLACEMENT OF TISSUE EXPANDER AND FLEX HD (ACELLULAR HYDRATED DERMIS): SHX6295

## 2016-04-14 HISTORY — PX: NIPPLE SPARING MASTECTOMY/SENTINAL LYMPH NODE BIOPSY/RECONSTRUCTION/PLACEMENT OF TISSUE EXPANDER: SHX6484

## 2016-04-14 SURGERY — NIPPLE SPARING MASTECTOMY WITH SENTINAL LYMPH NODE BIOPSY AND  RECONSTRUCTION WITH PLACEMENT OF TISSUE EXPANDER
Anesthesia: General | Site: Breast | Laterality: Left

## 2016-04-14 MED ORDER — CIPROFLOXACIN IN D5W 400 MG/200ML IV SOLN
400.0000 mg | Freq: Two times a day (BID) | INTRAVENOUS | Status: DC
  Administered 2016-04-14 – 2016-04-15 (×2): 400 mg via INTRAVENOUS
  Filled 2016-04-14 (×3): qty 200

## 2016-04-14 MED ORDER — ACETAMINOPHEN 325 MG PO TABS: 325.0000 mg | ORAL_TABLET | ORAL | Status: DC | PRN

## 2016-04-14 MED ORDER — ONDANSETRON HCL 4 MG/2ML IJ SOLN
INTRAMUSCULAR | Status: DC | PRN
  Administered 2016-04-14: 4 mg via INTRAVENOUS

## 2016-04-14 MED ORDER — OXYCODONE HCL 5 MG PO TABS
5.0000 mg | ORAL_TABLET | Freq: Once | ORAL | Status: AC | PRN
  Administered 2016-04-14: 5 mg via ORAL

## 2016-04-14 MED ORDER — HYDROMORPHONE HCL 1 MG/ML IJ SOLN
0.2500 mg | INTRAMUSCULAR | Status: DC | PRN
  Administered 2016-04-14 (×2): 0.5 mg via INTRAVENOUS

## 2016-04-14 MED ORDER — SIMETHICONE 80 MG PO CHEW: 40.0000 mg | CHEWABLE_TABLET | Freq: Four times a day (QID) | ORAL | Status: DC | PRN

## 2016-04-14 MED ORDER — FENTANYL CITRATE (PF) 100 MCG/2ML IJ SOLN
INTRAMUSCULAR | Status: DC | PRN
  Administered 2016-04-14: 100 ug via INTRAVENOUS
  Administered 2016-04-14 (×2): 50 ug via INTRAVENOUS

## 2016-04-14 MED ORDER — FENTANYL CITRATE (PF) 100 MCG/2ML IJ SOLN
INTRAMUSCULAR | Status: AC
  Filled 2016-04-14: qty 2

## 2016-04-14 MED ORDER — ACETAMINOPHEN 160 MG/5ML PO SOLN
325.0000 mg | ORAL | Status: DC | PRN
  Filled 2016-04-14: qty 20.3

## 2016-04-14 MED ORDER — CHLORHEXIDINE GLUCONATE CLOTH 2 % EX PADS: 6.0000 | MEDICATED_PAD | Freq: Once | CUTANEOUS | Status: DC

## 2016-04-14 MED ORDER — KETOROLAC TROMETHAMINE 30 MG/ML IJ SOLN
INTRAMUSCULAR | Status: AC
  Administered 2016-04-14: 30 mg via INTRAVENOUS
  Filled 2016-04-14: qty 1

## 2016-04-14 MED ORDER — OXYCODONE HCL 5 MG PO TABS
5.0000 mg | ORAL_TABLET | ORAL | Status: DC | PRN
  Administered 2016-04-15: 10 mg via ORAL
  Administered 2016-04-15: 5 mg via ORAL
  Filled 2016-04-14: qty 2
  Filled 2016-04-14: qty 1

## 2016-04-14 MED ORDER — WHITE PETROLATUM GEL
Status: AC
Start: 2016-04-14 — End: 2016-04-14
  Administered 2016-04-14: 16:00:00
  Filled 2016-04-14: qty 1

## 2016-04-14 MED ORDER — ALBUTEROL SULFATE (2.5 MG/3ML) 0.083% IN NEBU: 3.0000 mL | INHALATION_SOLUTION | Freq: Four times a day (QID) | RESPIRATORY_TRACT | Status: DC | PRN

## 2016-04-14 MED ORDER — SODIUM CHLORIDE 0.9 % IR SOLN
Status: DC | PRN
  Administered 2016-04-14: 70 mL

## 2016-04-14 MED ORDER — 0.9 % SODIUM CHLORIDE (POUR BTL) OPTIME
TOPICAL | Status: DC | PRN
  Administered 2016-04-14: 1000 mL

## 2016-04-14 MED ORDER — LACTATED RINGERS IV SOLN
INTRAVENOUS | Status: DC
  Administered 2016-04-14 (×2): via INTRAVENOUS

## 2016-04-14 MED ORDER — PROPOFOL 10 MG/ML IV BOLUS
INTRAVENOUS | Status: DC | PRN
  Administered 2016-04-14: 70 mg via INTRAVENOUS
  Administered 2016-04-14: 130 mg via INTRAVENOUS

## 2016-04-14 MED ORDER — SODIUM CHLORIDE 0.9 % IR SOLN
Status: DC | PRN
  Administered 2016-04-14: 500 mL

## 2016-04-14 MED ORDER — ONDANSETRON HCL 4 MG/2ML IJ SOLN: 4.0000 mg | Freq: Four times a day (QID) | INTRAMUSCULAR | Status: DC | PRN

## 2016-04-14 MED ORDER — KETOROLAC TROMETHAMINE 30 MG/ML IJ SOLN
30.0000 mg | Freq: Three times a day (TID) | INTRAMUSCULAR | Status: AC
  Administered 2016-04-14 (×3): 30 mg via INTRAVENOUS
  Filled 2016-04-14: qty 1

## 2016-04-14 MED ORDER — OXYCODONE HCL 5 MG/5ML PO SOLN: 5.0000 mg | Freq: Once | ORAL | Status: AC | PRN

## 2016-04-14 MED ORDER — HYDROMORPHONE HCL 1 MG/ML IJ SOLN
INTRAMUSCULAR | Status: AC
  Administered 2016-04-14: 0.5 mg via INTRAVENOUS
  Filled 2016-04-14: qty 1

## 2016-04-14 MED ORDER — SODIUM CHLORIDE 0.9 % IV SOLN
INTRAVENOUS | Status: DC
  Administered 2016-04-14: 18:00:00 via INTRAVENOUS

## 2016-04-14 MED ORDER — ACETAMINOPHEN 650 MG RE SUPP
650.0000 mg | Freq: Four times a day (QID) | RECTAL | Status: DC | PRN
Start: 2016-04-14 — End: 2016-04-15

## 2016-04-14 MED ORDER — ROCURONIUM BROMIDE 100 MG/10ML IV SOLN
INTRAVENOUS | Status: DC | PRN
  Administered 2016-04-14: 20 mg via INTRAVENOUS
  Administered 2016-04-14: 50 mg via INTRAVENOUS

## 2016-04-14 MED ORDER — MIDAZOLAM HCL 5 MG/5ML IJ SOLN
INTRAMUSCULAR | Status: DC | PRN
  Administered 2016-04-14: 1 mg via INTRAVENOUS

## 2016-04-14 MED ORDER — SUGAMMADEX SODIUM 200 MG/2ML IV SOLN
INTRAVENOUS | Status: DC | PRN
  Administered 2016-04-14: 200 mg via INTRAVENOUS

## 2016-04-14 MED ORDER — MIDAZOLAM HCL 2 MG/2ML IJ SOLN
INTRAMUSCULAR | Status: AC
  Filled 2016-04-14: qty 2

## 2016-04-14 MED ORDER — PROPOFOL 10 MG/ML IV BOLUS
INTRAVENOUS | Status: AC
  Filled 2016-04-14: qty 20

## 2016-04-14 MED ORDER — MORPHINE SULFATE (PF) 2 MG/ML IV SOLN: 2.0000 mg | INTRAVENOUS | Status: DC | PRN

## 2016-04-14 MED ORDER — ONDANSETRON HCL 4 MG/2ML IJ SOLN
INTRAMUSCULAR | Status: AC
  Filled 2016-04-14: qty 2

## 2016-04-14 MED ORDER — SUCCINYLCHOLINE CHLORIDE 200 MG/10ML IV SOSY
PREFILLED_SYRINGE | INTRAVENOUS | Status: AC
  Filled 2016-04-14: qty 10

## 2016-04-14 MED ORDER — EPHEDRINE 5 MG/ML INJ
INTRAVENOUS | Status: AC
  Filled 2016-04-14: qty 10

## 2016-04-14 MED ORDER — OXYCODONE HCL 5 MG PO TABS
ORAL_TABLET | ORAL | Status: AC
  Filled 2016-04-14: qty 1

## 2016-04-14 MED ORDER — ONDANSETRON 4 MG PO TBDP: 4.0000 mg | ORAL_TABLET | Freq: Four times a day (QID) | ORAL | Status: DC | PRN

## 2016-04-14 MED ORDER — ACETAMINOPHEN 325 MG PO TABS
650.0000 mg | ORAL_TABLET | Freq: Four times a day (QID) | ORAL | Status: DC | PRN
Start: 2016-04-14 — End: 2016-04-15

## 2016-04-14 MED ORDER — METHOCARBAMOL 500 MG PO TABS
ORAL_TABLET | ORAL | Status: AC
  Filled 2016-04-14: qty 1

## 2016-04-14 MED ORDER — METHOCARBAMOL 500 MG PO TABS
500.0000 mg | ORAL_TABLET | Freq: Four times a day (QID) | ORAL | Status: DC | PRN
  Administered 2016-04-14: 500 mg via ORAL

## 2016-04-14 MED ORDER — LEVOTHYROXINE SODIUM 100 MCG PO TABS
100.0000 ug | ORAL_TABLET | Freq: Every day | ORAL | Status: DC
  Administered 2016-04-14 – 2016-04-15 (×2): 100 ug via ORAL
  Filled 2016-04-14 (×2): qty 1

## 2016-04-14 MED ORDER — TECHNETIUM TC 99M SULFUR COLLOID FILTERED
1.0000 | Freq: Once | INTRAVENOUS | Status: AC | PRN
  Administered 2016-04-14: 1 via INTRADERMAL

## 2016-04-14 MED ORDER — NAPHAZOLINE-PHENIRAMINE 0.025-0.3 % OP SOLN
1.0000 [drp] | Freq: Four times a day (QID) | OPHTHALMIC | Status: DC | PRN
  Filled 2016-04-14: qty 5

## 2016-04-14 MED ORDER — OXYMETAZOLINE HCL 0.05 % NA SOLN
NASAL | Status: AC
  Filled 2016-04-14: qty 15

## 2016-04-14 SURGICAL SUPPLY — 67 items
APPLIER CLIP 9.375 MED OPEN (MISCELLANEOUS) ×3
APR CLP MED 9.3 20 MLT OPN (MISCELLANEOUS) ×1
BAG DECANTER FOR FLEXI CONT (MISCELLANEOUS) ×3 IMPLANT
BINDER BREAST LRG (GAUZE/BANDAGES/DRESSINGS) ×2 IMPLANT
CANISTER SUCTION 2500CC (MISCELLANEOUS) ×4 IMPLANT
CHLORAPREP W/TINT 26ML (MISCELLANEOUS) ×4 IMPLANT
CLIP APPLIE 9.375 MED OPEN (MISCELLANEOUS) IMPLANT
CONT SPEC 4OZ CLIKSEAL STRL BL (MISCELLANEOUS) ×1 IMPLANT
COVER PROBE W GEL 5X96 (DRAPES) ×3 IMPLANT
COVER SURGICAL LIGHT HANDLE (MISCELLANEOUS) ×6 IMPLANT
DRAIN CHANNEL 15F RND FF W/TCR (WOUND CARE) IMPLANT
DRAIN CHANNEL 19F RND (DRAIN) ×3 IMPLANT
DRAPE ORTHO SPLIT 77X108 STRL (DRAPES) ×6
DRAPE PROXIMA HALF (DRAPES) ×4 IMPLANT
DRAPE SURG ORHT 6 SPLT 77X108 (DRAPES) ×2 IMPLANT
DRAPE WARM FLUID 44X44 (DRAPE) ×3 IMPLANT
DRSG PAD ABDOMINAL 8X10 ST (GAUZE/BANDAGES/DRESSINGS) ×4 IMPLANT
DRSG TEGADERM 2-3/8X2-3/4 SM (GAUZE/BANDAGES/DRESSINGS) ×3 IMPLANT
DRSG TEGADERM 4X4.75 (GAUZE/BANDAGES/DRESSINGS) ×12 IMPLANT
ELECT BLADE 4.0 EZ CLEAN MEGAD (MISCELLANEOUS) ×3
ELECT BLADE 6.5 EXT (BLADE) ×3 IMPLANT
ELECT CAUTERY BLADE 6.4 (BLADE) ×6 IMPLANT
ELECT COATED BLADE 2.86 ST (ELECTRODE) ×3 IMPLANT
ELECT REM PT RETURN 9FT ADLT (ELECTROSURGICAL) ×3
ELECTRODE BLDE 4.0 EZ CLN MEGD (MISCELLANEOUS) ×2 IMPLANT
ELECTRODE REM PT RTRN 9FT ADLT (ELECTROSURGICAL) ×2 IMPLANT
EVACUATOR SILICONE 100CC (DRAIN) ×3 IMPLANT
GAUZE SPONGE 4X4 12PLY STRL (GAUZE/BANDAGES/DRESSINGS) ×2 IMPLANT
GLOVE BIO SURGEON STRL SZ 6 (GLOVE) ×9 IMPLANT
GLOVE BIO SURGEON STRL SZ7 (GLOVE) ×3 IMPLANT
GLOVE BIOGEL PI IND STRL 7.5 (GLOVE) ×1 IMPLANT
GLOVE BIOGEL PI INDICATOR 7.5 (GLOVE) ×4
GLOVE SURG SS PI 7.0 STRL IVOR (GLOVE) ×4 IMPLANT
GOWN STRL REUS W/ TWL LRG LVL3 (GOWN DISPOSABLE) ×5 IMPLANT
GOWN STRL REUS W/TWL LRG LVL3 (GOWN DISPOSABLE) ×12
KIT BASIN OR (CUSTOM PROCEDURE TRAY) ×4 IMPLANT
KIT ROOM TURNOVER OR (KITS) ×4 IMPLANT
LIGHT WAVEGUIDE WIDE FLAT (MISCELLANEOUS) ×5 IMPLANT
LIQUID BAND (GAUZE/BANDAGES/DRESSINGS) ×4 IMPLANT
MARKER SKIN DUAL TIP RULER LAB (MISCELLANEOUS) ×4 IMPLANT
NS IRRIG 1000ML POUR BTL (IV SOLUTION) ×5 IMPLANT
PACK GENERAL/GYN (CUSTOM PROCEDURE TRAY) ×4 IMPLANT
PAD ARMBOARD 7.5X6 YLW CONV (MISCELLANEOUS) ×4 IMPLANT
PIN SAFETY STERILE (MISCELLANEOUS) ×4 IMPLANT
SET ASEPTIC TRANSFER (MISCELLANEOUS) ×3 IMPLANT
SOLUTION BETADINE 4OZ (MISCELLANEOUS) ×3 IMPLANT
SPECIMEN JAR X LARGE (MISCELLANEOUS) ×1 IMPLANT
STAPLER VISISTAT 35W (STAPLE) ×4 IMPLANT
SUT ETHILON 2 0 FS 18 (SUTURE) ×3 IMPLANT
SUT MNCRL 3 0 RB1 (SUTURE) IMPLANT
SUT MNCRL AB 4-0 PS2 18 (SUTURE) ×3 IMPLANT
SUT MON AB 5-0 PS2 18 (SUTURE) IMPLANT
SUT MONOCRYL 3 0 RB1 (SUTURE)
SUT SILK 2 0 SH (SUTURE) ×2 IMPLANT
SUT VIC AB 3-0 54X BRD REEL (SUTURE) ×1 IMPLANT
SUT VIC AB 3-0 BRD 54 (SUTURE)
SUT VIC AB 3-0 SH 18 (SUTURE) ×1 IMPLANT
SUT VIC AB 3-0 SH 27 (SUTURE) ×9
SUT VIC AB 3-0 SH 27X BRD (SUTURE) IMPLANT
SUT VIC AB 4-0 PS2 27 (SUTURE) IMPLANT
SUT VICRYL 3 0 (SUTURE) IMPLANT
SUT VICRYL 4-0 PS2 18IN ABS (SUTURE) ×2 IMPLANT
SYR BULB IRRIGATION 50ML (SYRINGE) ×3 IMPLANT
TISSUE EXPANDER MV 300CC (Prosthesis & Implant Plastic) ×2 IMPLANT
TOWEL OR 17X24 6PK STRL BLUE (TOWEL DISPOSABLE) ×4 IMPLANT
TOWEL OR 17X26 10 PK STRL BLUE (TOWEL DISPOSABLE) ×4 IMPLANT
TRAY FOLEY CATH 16FRSI W/METER (SET/KITS/TRAYS/PACK) IMPLANT

## 2016-04-14 NOTE — Transfer of Care (Signed)
Immediate Anesthesia Transfer of Care Note  Patient: Laura Landry  Procedure(s) Performed: Procedure(s): LEFT NIPPLE SPARING MASTECTOMY WITH LEFT AXILLARY SENTINEL LYMPH NODE BIOPSY (Left) LEFT BREAST RECONSTRUCTION WITH PLACEMENT OF TISSUE EXPANDER (Left)  Patient Location: PACU  Anesthesia Type:General  Level of Consciousness: awake and patient cooperative  Airway & Oxygen Therapy: Patient Spontanous Breathing and Patient connected to nasal cannula oxygen  Post-op Assessment: Report given to RN, Post -op Vital signs reviewed and stable and Patient moving all extremities X 4  Post vital signs: Reviewed and stable  Last Vitals:  Vitals:   04/14/16 0919  BP: (!) 170/85  Pulse: 69  Resp: 18  Temp: 36.5 C    Last Pain:  Vitals:   04/14/16 0919  TempSrc: Oral         Complications: No apparent anesthesia complications

## 2016-04-14 NOTE — Anesthesia Postprocedure Evaluation (Signed)
Anesthesia Post Note  Patient: Laura Landry  Procedure(s) Performed: Procedure(s) (LRB): LEFT NIPPLE SPARING MASTECTOMY WITH LEFT AXILLARY SENTINEL LYMPH NODE BIOPSY (Left) LEFT BREAST RECONSTRUCTION WITH PLACEMENT OF TISSUE EXPANDER (Left)  Patient location during evaluation: PACU Anesthesia Type: General Level of consciousness: awake and alert Pain management: pain level controlled Vital Signs Assessment: post-procedure vital signs reviewed and stable Respiratory status: spontaneous breathing, nonlabored ventilation, respiratory function stable and patient connected to nasal cannula oxygen Cardiovascular status: blood pressure returned to baseline and stable Postop Assessment: no signs of nausea or vomiting Anesthetic complications: no    Last Vitals:  Vitals:   04/14/16 1504 04/14/16 1530  BP: 130/77 129/69  Pulse: (!) 59 67  Resp: 11 17  Temp:  36.3 C    Last Pain:  Vitals:   04/14/16 1530  TempSrc: Oral  PainSc:                  Seve Monette DAVID

## 2016-04-14 NOTE — Interval H&P Note (Signed)
History and Physical Interval Note:  04/14/2016 10:46 AM  Laura Landry  has presented today for surgery, with the diagnosis of LEFT BREAST CANCER  The various methods of treatment have been discussed with the patient and family. After consideration of risks, benefits and other options for treatment, the patient has consented to  Procedure(s): LEFT NIPPLE SPARING MASTECTOMY WITH SENTINAL LYMPH NODE BIOPSY (Left) LEFT BREAST RECONSTRUCTION WITH PLACEMENT OF TISSUE EXPANDER AND POSSIBLE CORTIVA (Left) as a surgical intervention .  The patient's history has been reviewed, patient examined, no change in status, stable for surgery.  I have reviewed the patient's chart and labs.  Questions were answered to the patient's satisfaction.     [name]

## 2016-04-14 NOTE — Anesthesia Procedure Notes (Signed)
Anesthesia Regional Block:  Pectoralis block  Pre-Anesthetic Checklist: ,, timeout performed, Correct Patient, Correct Site, Correct Laterality, Correct Procedure, Correct Position, site marked, Risks and benefits discussed,  Surgical consent,  Pre-op evaluation,  At surgeon's request and post-op pain management  Laterality: Left  Prep: chloraprep       Needles:  Injection technique: Single-shot  Needle Type: Echogenic Stimulator Needle          Additional Needles:  Procedures: ultrasound guided (picture in chart) Pectoralis block Narrative:   Performed by: Personally  Anesthesiologist: Jodine Muchmore

## 2016-04-14 NOTE — Op Note (Signed)
Preoperative diagnosis: Clinical stage II left breast cancer   Postoperative diagnosis: Same as above Procedure: #1 left nipple sparing mastectomy #2 left axillary sentinel lymph node biopsy Surgeon: Dr. Serita Grammes Asst: Dr Irene Limbo Anesthesia: Gen. With pectoral block Specimens: #1 left breast marked short superior, long lateral, double nipple areolar complex #2 left axillary sentinel nodes with highest count of 937 #3 left nipple margin Complications: None Drains: per plastic surgery Sponge count was correct at completion Decision to recovery stable condition  Indications: This a 25 yof with clinical stage II left breast cancer that appears clinically node negative. The mass is large enough to require mastectomy. It is causing some dimpling of the skin. We discussed nsm with excision of this skin.  This is going to be done in combination with reconstruction.   Procedure: After informed consent was obtained the patient was taken the operating room. She had undergone a pectoral block as well as injection with technetium in the standard periareolar fashion. She was then placed under general anesthesia without complication. Her left breast and axilla was then prepped and draped in the standard sterile surgical fashion. Surgical timeout was then performed.  I made an inframammary incision and this was brought vertical and an ellipse of skin was removed over the tumor.  I then removed the breast and the pectoralis fascia posteriorly.  I did remove some muscle as the tumor was very adherent.  I then created the anterior flap.  This appeared clear from the skin.  This was done to the parasternal region, clavicle and latissimus laterally. This was then passed off the table and then marked as above. I then removed the nipple as a separate margin.  The flaps were viable as was the nac at completion.  I then used the neoprobe to identify what appeared to be a sentinel lymph node with the  highest count listed as above. This were excised and sent as a specimen. There was no background radioactivity. Hemostasis was obtained. I then turned the case over to Dr Iran Planas for completion.

## 2016-04-14 NOTE — Interval H&P Note (Signed)
History and Physical Interval Note:  04/14/2016 9:55 AM  Laura Landry  has presented today for surgery, with the diagnosis of LEFT BREAST CANCER  The various methods of treatment have been discussed with the patient and family. After consideration of risks, benefits and other options for treatment, the patient has consented to  Procedure(s): LEFT NIPPLE SPARING MASTECTOMY WITH SENTINAL LYMPH NODE BIOPSY (Left) LEFT BREAST RECONSTRUCTION WITH PLACEMENT OF TISSUE EXPANDER AND POSSIBLE CORTIVA (Left) as a surgical intervention .  The patient's history has been reviewed, patient examined, no change in status, stable for surgery.  I have reviewed the patient's chart and labs.  Questions were answered to the patient's satisfaction.     [name]

## 2016-04-14 NOTE — Anesthesia Preprocedure Evaluation (Addendum)
Anesthesia Evaluation  Patient identified by MRN, date of birth, ID band Patient awake    Reviewed: Allergy & Precautions, NPO status , Patient's Chart, lab work & pertinent test results  History of Anesthesia Complications Negative for: history of anesthetic complications  Airway Mallampati: II  TM Distance: >3 FB Neck ROM: Full    Dental  (+) Teeth Intact, Dental Advisory Given, Caps   Pulmonary asthma , former smoker,  Seasonal allergies   breath sounds clear to auscultation       Cardiovascular negative cardio ROS   Rhythm:Regular     Neuro/Psych Anxiety negative neurological ROS     GI/Hepatic negative GI ROS, Neg liver ROS,   Endo/Other  Hypothyroidism   Renal/GU negative Renal ROS     Musculoskeletal   Abdominal   Peds  Hematology   Anesthesia Other Findings   Reproductive/Obstetrics                            Anesthesia Physical Anesthesia Plan  ASA: II  Anesthesia Plan: General and Regional   Post-op Pain Management:  Regional for Post-op pain   Induction: Intravenous  Airway Management Planned: Oral ETT  Additional Equipment: None  Intra-op Plan:   Post-operative Plan: Extubation in OR  Informed Consent: I have reviewed the patients History and Physical, chart, labs and discussed the procedure including the risks, benefits and alternatives for the proposed anesthesia with the patient or authorized representative who has indicated his/her understanding and acceptance.   Dental advisory given  Plan Discussed with: CRNA, Anesthesiologist and Surgeon  Anesthesia Plan Comments:        Anesthesia Quick Evaluation

## 2016-04-14 NOTE — Op Note (Signed)
Operative Note   DATE OF OPERATION: 9.12.17  LOCATION: [place] [facility] Main OR-observation  SURGICAL DIVISION: Plastic Surgery  PREOPERATIVE DIAGNOSES:  Left breast cancer, lower inner quadrant  POSTOPERATIVE DIAGNOSES:  same  PROCEDURE:  1. Left breast reconstruction with tissue expander  SURGEON: Irene Limbo MD MBA  ASSISTANT: none  ANESTHESIA:  General.   EBL: 100 ml for entire procedure  COMPLICATIONS: None immediate.   INDICATIONS FOR PROCEDURE:  The patient, Laura Landry, is a 60 y.o. female born on Apr 25, 1956, is here for immediate expander based reconstruction following nipple sparing mastectomy.   FINDINGS: Natrelle 133 MV-12-T 300 ml tissue expander placed, initial fill volume 70 ml. SN [id number] The patient's tumor was located with lower inner quadrant and there was tethering of overlying skin and soft tissue. The mass was not fixed to pectoralis. Given this, the inframammary scar was brought vertically onto lower pole breast to allow for resection of overlying skin.   DESCRIPTION OF PROCEDURE:  The patient's operative site was marked with the patient in the preoperative area. The patient was taken to the operating room. SCDs were placed and IV antibiotics were given. The patient's operative site was prepped and draped in a sterile fashion. A time out was performed and all information was confirmed to be correct.  Following completion of mastectomy, reconstruction began on left side. The pectoralis major muscle was elevated continuous with abdominal wall fascia. The serratus fascia and muscle were elevated laterally. A 19 Fr drain was placed in subcutaneous position laterally and submuscular position medialy and secured to skin with 2-0 nylon. The cavity was irrigated with solution containing polymyxin and bacitracin. Hemostasis was ensured. Cavity irrigated with Betadine. The tissue expander was prepared and placed in submuscular position. The expander was secured to chest wall with a  3-0 vicryl. The pectoralis was secured to elevated serratus fascia and muscle with interrupted figure of eight 3-0 vicryl. The incision was closed with 3-0 vicryl in fascial layer and 4-0 vicryl in dermis. Skin closure completed with 4-0 monocryl subcuticular and tissue adhesive. At completion of procedure, there was essentially no redundancy of the skin flap. I was unable to alter the position of NAC given this lack of redundancy following the skin excision overlying tumor and the premorbid small volume breast. Transparent, adherent dressings applied. Dry dressing and breast binder applied.  The patient was allowed to wake from anesthesia, extubated and taken to the recovery room in satisfactory condition.   SPECIMENS: none  DRAINS: 19 Fr JP in left breast reconstruction  Irene Limbo, MD [facility]astic [facility] Surgery [phone], pin [phone]

## 2016-04-14 NOTE — Anesthesia Procedure Notes (Signed)
Procedure Name: Intubation Date/Time: 04/14/2016 11:35 AM Performed by: Carney Living Pre-anesthesia Checklist: Patient identified, Emergency Drugs available, Suction available, Patient being monitored and Timeout performed Patient Re-evaluated:Patient Re-evaluated prior to inductionOxygen Delivery Method: Circle system utilized Preoxygenation: Pre-oxygenation with 100% oxygen Intubation Type: IV induction Ventilation: Mask ventilation without difficulty Laryngoscope Size: Mac and 4 Grade View: Grade I Tube type: Oral Tube size: 7.0 mm Number of attempts: 1 Airway Equipment and Method: Stylet Placement Confirmation: ETT inserted through vocal cords under direct vision,  positive ETCO2 and breath sounds checked- equal and bilateral Secured at: 21 cm Tube secured with: Tape Dental Injury: Teeth and Oropharynx as per pre-operative assessment

## 2016-04-14 NOTE — H&P (Signed)
60 yof who is here with her husband Artie for recent diagnosis of new left breast cancer. She is seen in consultation from Dr Gunnar Bulla Magrinat. She underwent screening mm that shows a left breast mass with skin retraction. the mass is 2 cm in size by mm and 2.4 cm by [place]. [place] of her axilla is negative. breast density is C. she underwent core biopsy that shows a grade II ILC that is er pos, pr neg, Her 2 negative and Ki is 30%. she has significant family history of breast cancer in her mom and two aunts with ovarian cancer. she has no complaints referable to either breast with mass or dc. she has noted now since made aware it feels like her skin is pulled in at 6 oclock in the left breast.    Other Problems Conni Slipper, RN; 03/18/2016 8:09 AM) Anxiety Disorder Lump In Breast Thyroid Disease  Past Surgical History Conni Slipper, RN; 03/18/2016 8:09 AM) Breast Biopsy Left.  Diagnostic Studies History Conni Slipper, RN; 03/18/2016 8:09 AM) Colonoscopy 1-5 years ago Mammogram within last year Pap Smear 1-5 years ago  Medication History Conni Slipper, RN; 03/18/2016 8:09 AM) Medications Reconciled  Social History Conni Slipper, RN; 03/18/2016 8:09 AM) Alcohol use Moderate alcohol use. Caffeine use Coffee. No drug use Tobacco use Former smoker.  Family History Conni Slipper, RN; 03/18/2016 8:09 AM) Arthritis Father. Breast Cancer Mother. Colon Cancer Family Members In General. Diabetes Mellitus Brother. Hypertension Father. Ischemic Bowel Disease Family Members In General. Kidney Disease Mother. Ovarian Cancer Family Members In General. Thyroid problems Family Members In General, Mother.  Pregnancy / Birth History Conni Slipper, RN; 03/18/2016 8:09 AM) Age at menarche 60 years. Age of menopause [age] Contraceptive History Oral contraceptives. Gravida 2 Length (months) of breastfeeding 7-12 Maternal age 59-25 Para 2  Review of Systems Conni Slipper RN; 03/18/2016  8:09 AM) General Present- Fatigue and Night Sweats. Not Present- Appetite Loss, Chills, Fever, Weight Gain and Weight Loss. Skin Not Present- Change in Wart/Mole, Dryness, Hives, Jaundice, New Lesions, Non-Healing Wounds, Rash and Ulcer. HEENT Present- Hearing Loss, Seasonal Allergies and Wears glasses/contact lenses. Not Present- Earache, Hoarseness, Nose Bleed, Oral Ulcers, Ringing in the Ears, Sinus Pain, Sore Throat, Visual Disturbances and Yellow Eyes. Respiratory Not Present- Bloody sputum, Chronic Cough, Difficulty Breathing, Snoring and Wheezing. Breast Present- Breast Mass. Not Present- Breast Pain, Nipple Discharge and Skin Changes. Cardiovascular Not Present- Chest Pain, Difficulty Breathing Lying Down, Leg Cramps, Palpitations, Rapid Heart Rate, Shortness of Breath and Swelling of Extremities. Gastrointestinal Present- Bloating. Not Present- Abdominal Pain, Bloody Stool, Change in Bowel Habits, Chronic diarrhea, Constipation, Difficulty Swallowing, Excessive gas, Gets full quickly at meals, Hemorrhoids, Indigestion, Nausea, Rectal Pain and Vomiting. Female Genitourinary Not Present- Frequency, Nocturia, Painful Urination, Pelvic Pain and Urgency. Musculoskeletal Not Present- Back Pain, Joint Pain, Joint Stiffness, Muscle Pain, Muscle Weakness and Swelling of Extremities. Neurological Not Present- Decreased Memory, Fainting, Headaches, Numbness, Seizures, Tingling, Tremor, Trouble walking and Weakness. Psychiatric Not Present- Anxiety, Bipolar, Change in Sleep Pattern, Depression, Fearful and Frequent crying. Endocrine Not Present- Cold Intolerance, Excessive Hunger, Hair Changes, Heat Intolerance, Hot flashes and New Diabetes. Hematology Not Present- Blood Thinners, Easy Bruising, Excessive bleeding, Gland problems, HIV and Persistent Infections.   Physical Exam Rolm Bookbinder MD; 03/18/2016 4:02 PM) General Mental Status-Alert. Orientation-Oriented  X3.  Eye Sclera/Conjunctiva - Bilateral-No scleral icterus.  Chest and Lung Exam Chest and lung exam reveals -quiet, even and easy respiratory effort with no use of accessory muscles and  on auscultation, normal breath sounds, no adventitious sounds and normal vocal resonance.  Breast Nipples-No Discharge. Note: no right breast mass, left breast at six oclock with 2 cm hematoma/mass and overlying skin retraction   Cardiovascular Cardiovascular examination reveals -normal heart sounds, regular rate and rhythm with no murmurs.  Lymphatic Head & Neck  General Head & Neck Lymphatics: Bilateral - Description - Normal. Axillary  General Axillary Region: Bilateral - Description - Normal. Note: no [place] adenopathy   Assessment & Plan Rolm Bookbinder MD; 03/30/2016 2:41 PM) BREAST CANCER OF LOWER-INNER QUADRANT OF LEFT FEMALE BREAST (C50.312) Story: Left nsm, left ax sn biopsy, reconstruction I think reasonable to consider nsm and will excise some skin inferiorly. we discussed that I think due to size and location that mastectomy is best. she is very interested in nsm. I discussed particular risks to this procedure including positive nipple margin necessitating excision, nipple necrosis either partial or total, skin necrosis, loss of nac sensation as well as bleeding, infection. I will have her see Dr Iran Planas and then proceed from there

## 2016-04-15 ENCOUNTER — Encounter (HOSPITAL_COMMUNITY): Payer: Self-pay | Admitting: General Surgery

## 2016-04-15 DIAGNOSIS — C50312 Malignant neoplasm of lower-inner quadrant of left female breast: Secondary | ICD-10-CM | POA: Diagnosis not present

## 2016-04-15 MED ORDER — DOXYCYCLINE HYCLATE 50 MG PO CAPS: 50.0000 mg | ORAL_CAPSULE | Freq: Two times a day (BID) | ORAL | 0 refills | Status: DC

## 2016-04-15 MED ORDER — METHOCARBAMOL 500 MG PO TABS: 500.0000 mg | ORAL_TABLET | Freq: Three times a day (TID) | ORAL | 0 refills | Status: DC | PRN

## 2016-04-15 MED ORDER — OXYCODONE HCL 5 MG PO TABS: 5.0000 mg | ORAL_TABLET | ORAL | 0 refills | Status: DC | PRN

## 2016-04-15 MED ORDER — KETOROLAC TROMETHAMINE 15 MG/ML IJ SOLN
15.0000 mg | Freq: Once | INTRAMUSCULAR | Status: AC
  Administered 2016-04-15: 15 mg via INTRAVENOUS
  Filled 2016-04-15: qty 1

## 2016-04-15 MED ORDER — DIPHENHYDRAMINE HCL 25 MG PO CAPS
25.0000 mg | ORAL_CAPSULE | Freq: Once | ORAL | Status: AC
  Administered 2016-04-15: 25 mg via ORAL
  Filled 2016-04-15: qty 1

## 2016-04-15 NOTE — Discharge Summary (Signed)
Physician Discharge Summary  Patient ID: Laura Landry MRN: [id number] DOB/AGE: 1956/04/03 60 y.o.  Admit date: 04/14/2016 Discharge date: 04/15/2016  Admission Diagnoses: Left breast cancer  Discharge Diagnoses:  Active Problems:   Breast cancer [facility])   Discharged Condition: stable  Hospital Course: Patient did well post operatively with good pain control and ambulated with minimal assist. She experienced rash and swelling right arm during Cipro infusion and this resolved with stopping infusion.   Treatments: surgery: left nipple sparing mastectomy, tissue expander reconstruction, sentinel node  Discharge Exam: Blood pressure 115/63, pulse 66, temperature 97.9 F (36.6 C), temperature source Oral, resp. rate 16, height 5\' 5"  (1.651 m), weight 66.8 kg (147 lb 4.3 oz), SpO2 98 %. Incision/Wound: chest soft flap, JP serosanguinous  Disposition: home  Discharge Instructions    Call MD for:  redness, tenderness, or signs of infection (pain, swelling, bleeding, redness, odor or green/yellow discharge around incision site)    Complete by:  As directed    Call MD for:  severe or increased pain, loss or decreased feeling  in affected limb(s)    Complete by:  As directed    Call MD for:  temperature >100.5    Complete by:  As directed    Discharge instructions    Complete by:  As directed    Ok to remove dressings and shower am 9.14.17. Soap and water ok, pat Tegaderms dry.  Leave Tegaderm in place until follow up visit. No creams or ointments over incisions. Do not let drain dangle in shower, attach to lanyard or similar.Strip and record drains twice daily and bring log to clinic visit.  Breast binder or soft compression bra all other times.  Ok to raise arms above shoulders for bathing and dressing.  No house yard work or exercise until cleared by MD.   Use Miralax as needed for constipation. This is over the counter.   Recommend use Motrin 400 mg with meals to help with pain  control.   Driving Restrictions    Complete by:  As directed    No driving while taking narcotics   Lifting restrictions    Complete by:  As directed    No lifting greater than 5 lbs   Resume previous diet    Complete by:  As directed        Medication List    TAKE these medications   albuterol 108 (90 Base) MCG/ACT inhaler Commonly known as:  PROVENTIL HFA;VENTOLIN HFA Inhale 1-2 puffs into the lungs every 6 (six) hours as needed for wheezing or shortness of breath.   ALIVE WOMENS 50+ PO Take 1 tablet by mouth daily.   calcium-vitamin D 500-200 MG-UNIT tablet Take 2 tablets by mouth daily.   doxycycline 50 MG capsule Commonly known as:  VIBRAMYCIN Take 1 capsule (50 mg total) by mouth 2 (two) times daily.   hydrocortisone cream 1 % Apply 1 application topically 2 (two) times daily as needed (nash).   ibuprofen 200 MG tablet Commonly known as:  ADVIL,MOTRIN Take 200 mg by mouth every 6 (six) hours as needed for mild pain.   levothyroxine 100 MCG tablet Commonly known as:  SYNTHROID, LEVOTHROID Take 1 tablet (100 mcg total) by mouth daily.   methocarbamol 500 MG tablet Commonly known as:  ROBAXIN Take 1 tablet (500 mg total) by mouth every 8 (eight) hours as needed for muscle spasms.   naphazoline-pheniramine 0.025-0.3 % ophthalmic solution Commonly known as:  NAPHCON-A Place 1 drop into both eyes 4 (  four) times daily as needed for irritation or allergies.   oxyCODONE 5 MG immediate release tablet Commonly known as:  Oxy IR/ROXICODONE Take 1-2 tablets (5-10 mg total) by mouth every 4 (four) hours as needed for moderate pain.   SALINE NASAL MIST NA Place 1 spray into the nose daily as needed (nasal congestion).   tamoxifen 10 MG tablet Commonly known as:  NOLVADEX Take 1 tablet (10 mg total) by mouth 2 (two) times daily.      Follow-up Information    WAKEFIELD,MATTHEW, MD In 3 weeks.   Specialty:  General Surgery Contact information: [address]  [place] [address] [phone]        Irene Limbo, MD In 1 week.   Specialty:  Plastic Surgery Why:  as scheduled Contact information: [address] [place] [address] [phone]           Signed: Irene Limbo 04/15/2016, 8:22 AM

## 2016-04-15 NOTE — Progress Notes (Signed)
Patient discharged to home with instructions. 

## 2016-04-15 NOTE — Progress Notes (Signed)
Patient ID: Laura Landry, female   DOB: 1955-10-11, 60 y.o.   MRN: [id number] Discussed surgery, path pending, will dc today

## 2016-04-22 ENCOUNTER — Telehealth: Payer: Self-pay | Admitting: Genetic Counselor

## 2016-04-22 ENCOUNTER — Telehealth: Payer: Self-pay | Admitting: *Deleted

## 2016-04-22 DIAGNOSIS — Z9012 Acquired absence of left breast and nipple: Secondary | ICD-10-CM | POA: Insufficient documentation

## 2016-04-22 NOTE — Telephone Encounter (Signed)
Called Laura Landry and discussed the results of the benefits investigation ordered on the Breast/Ovarian Cancer Panel through [employer].  Her estimated OOP cost is $0.  She is agreeable with this and will be here for genetic counseling on October 24th at 10 AM.  This BI is good until December 18th.

## 2016-04-23 ENCOUNTER — Encounter: Payer: Self-pay | Admitting: *Deleted

## 2016-04-24 ENCOUNTER — Ambulatory Visit: Payer: BLUE CROSS/BLUE SHIELD | Admitting: Oncology

## 2016-05-01 ENCOUNTER — Encounter: Payer: Self-pay | Admitting: Hematology & Oncology

## 2016-05-01 ENCOUNTER — Other Ambulatory Visit: Payer: BLUE CROSS/BLUE SHIELD

## 2016-05-01 ENCOUNTER — Ambulatory Visit (HOSPITAL_BASED_OUTPATIENT_CLINIC_OR_DEPARTMENT_OTHER): Payer: BLUE CROSS/BLUE SHIELD | Admitting: Hematology & Oncology

## 2016-05-01 ENCOUNTER — Ambulatory Visit: Payer: BLUE CROSS/BLUE SHIELD

## 2016-05-01 VITALS — BP 145/87 | HR 74 | Temp 97.7°F | Resp 16 | Ht 65.0 in | Wt 148.0 lb

## 2016-05-01 DIAGNOSIS — C50312 Malignant neoplasm of lower-inner quadrant of left female breast: Secondary | ICD-10-CM | POA: Diagnosis not present

## 2016-05-01 DIAGNOSIS — C773 Secondary and unspecified malignant neoplasm of axilla and upper limb lymph nodes: Secondary | ICD-10-CM

## 2016-05-01 DIAGNOSIS — E039 Hypothyroidism, unspecified: Secondary | ICD-10-CM

## 2016-05-01 DIAGNOSIS — Z800 Family history of malignant neoplasm of digestive organs: Secondary | ICD-10-CM

## 2016-05-01 DIAGNOSIS — Z803 Family history of malignant neoplasm of breast: Secondary | ICD-10-CM

## 2016-05-01 DIAGNOSIS — Z8041 Family history of malignant neoplasm of ovary: Secondary | ICD-10-CM

## 2016-05-01 MED ORDER — DEXAMETHASONE 4 MG PO TABS: ORAL_TABLET | ORAL | 0 refills | Status: DC

## 2016-05-01 NOTE — Progress Notes (Signed)
Referral MD  Reason for Referral: Stage IIB (T2N1M0) invasive lobular carcinoma of the LEFT breast - ER+/HER2-.  Oncotype score 26  Chief Complaint  Patient presents with  . Other    New patient  : I have breast cancer in the left breast  HPI: Laura Landry is a very charming 60yo post menopausal white femaleWho has been quite healthy. She does have hypo-thyroidism.  Her mother had breast cancer in her 74s. She had a screening mammogram back in July. This found a asymmetry in the left breast. This was in the upper inner quadrant. Ultrasound confirmed a 2 cm irregular mass.  She then underwent a biopsy on August 2. The pathology report ([id number]) showed an invasive lobular breast cancer area and it was ER positive and HER-2 negative.  She then had an MRI. MRI showed a 2.6 x 1.8 x 2.77 m mass at the 6-7:00 position. Lymph nodes appear to be normal in size.  She then underwent a mastectomy. She had immediate reconstruction with implant. The pathology report ([id number]) showed an invasive lobular carcinoma. This measured 2.5 cm. There was a focally positive margin. She had 1 lymph node that was positive. There is lymphovascular invasion.  She did have an Oncotype assay done. The score was high at 26.  She apparently was on tamoxifen for a couple weeks before surgery.  She does not want to take aromatase inhibitors. She feels that this will cause her to have cancer elsewhere. I'm not aware of these agents causing cancer. However, she gets on the Internet and finds these reports.  She has 2 children. Her first child was born when she was 65 years old. She went into menopause age 37. She's not been on any postmenopausal estrogen.  She does not smoke. She really does not drink.    she has a very good performance status. I'll set her performance status is ECOG 0.    she has been seen by radiation oncology already. I did this on her before she had her mastectomy. She currently is being seen  by plastic surgery. She is having left breast reconstruction with tissue expander.  Her appetite has been good. In reality, she really looks quite good. She certainly does not look her age.    Past Medical History:  Diagnosis Date  . Anemia    history of as child  . Anxiety   . Asthma    seasonal  . Breast cancer of lower-inner quadrant of left female breast ([place]) 03/09/2016  . Colitis   . Fatigue    very hard to wake up when fatigued  . Glaucoma    bilateral  . Hypothyroidism   :  Past Surgical History:  Procedure Laterality Date  . BREAST RECONSTRUCTION WITH PLACEMENT OF TISSUE EXPANDER AND FLEX HD (ACELLULAR HYDRATED DERMIS) Left 04/14/2016   Procedure: LEFT BREAST RECONSTRUCTION WITH PLACEMENT OF TISSUE EXPANDER;  Surgeon: Irene Limbo, MD;  Location: [facility];  Service: Plastics;  Laterality: Left;  . EYE SURGERY Left    laser surgery due to high  pressure  . NIPPLE SPARING MASTECTOMY/SENTINAL LYMPH NODE BIOPSY/RECONSTRUCTION/PLACEMENT OF TISSUE EXPANDER Left 04/14/2016   Procedure: LEFT NIPPLE SPARING MASTECTOMY WITH LEFT AXILLARY SENTINEL LYMPH NODE BIOPSY;  Surgeon: Rolm Bookbinder, MD;  Location: [facility];  Service: General;  Laterality: Left;  . WISDOM TOOTH EXTRACTION    :   Current Outpatient Prescriptions:  .  albuterol (PROVENTIL HFA;VENTOLIN HFA) 108 (90 Base) MCG/ACT inhaler, Inhale 1-2 puffs into the lungs every  6 (six) hours as needed for wheezing or shortness of breath., Disp: , Rfl:  .  Calcium Carbonate-Vitamin D (CALCIUM-VITAMIN D) 500-200 MG-UNIT tablet, Take 2 tablets by mouth daily. , Disp: , Rfl:  .  doxycycline (VIBRAMYCIN) 50 MG capsule, Take 1 capsule (50 mg total) by mouth 2 (two) times daily., Disp: 12 capsule, Rfl: 0 .  hydrocortisone cream 1 %, Apply 1 application topically 2 (two) times daily as needed (nash)., Disp: , Rfl:  .  ibuprofen (ADVIL,MOTRIN) 200 MG tablet, Take 200 mg by mouth every 6 (six) hours as needed for mild pain., Disp: , Rfl:   .  levothyroxine (SYNTHROID, LEVOTHROID) 100 MCG tablet, Take 1 tablet (100 mcg total) by mouth daily., Disp: 90 tablet, Rfl: 1 .  methocarbamol (ROBAXIN) 500 MG tablet, Take 1 tablet (500 mg total) by mouth every 8 (eight) hours as needed for muscle spasms., Disp: 30 tablet, Rfl: 0 .  Multiple Vitamins-Minerals (ALIVE WOMENS 50+ PO), Take 1 tablet by mouth daily. , Disp: , Rfl:  .  naphazoline-pheniramine (NAPHCON-A) 0.025-0.3 % ophthalmic solution, Place 1 drop into both eyes 4 (four) times daily as needed for irritation or allergies., Disp: , Rfl:  .  oxyCODONE (OXY IR/ROXICODONE) 5 MG immediate release tablet, Take 1-2 tablets (5-10 mg total) by mouth every 4 (four) hours as needed for moderate pain., Disp: 45 tablet, Rfl: 0 .  SALINE NASAL MIST NA, Place 1 spray into the nose daily as needed (nasal congestion)., Disp: , Rfl:  .  tamoxifen (NOLVADEX) 10 MG tablet, Take 1 tablet (10 mg total) by mouth 2 (two) times daily., Disp: 180 tablet, Rfl: 4:  :  Allergies  Allergen Reactions  . Clindamycin/Lincomycin Swelling    THROAT SWELLS  . Eggs Or Egg-Derived Products Swelling and Other (See Comments)    ABDOMINAL PAIN SWELLING, SWELLING REACTION UNSPECIFIED    . Ciprofloxacin Rash  . Codeine Hives and Swelling    SWELLING REACTION UNSPECIFIED   . Nickel Other (See Comments)    BLISTERS   . Penicillins Hives, Nausea And Vomiting, Swelling and Rash    SWELLING REACTION UNSPECIFIED  Has patient had PCN reaction causing immediate rash, facial/tongue/throat swelling, SOB or lightheadedness with hypotension: YES PCN reaction causing severe rash involving mucus membranes or skin necrosis: NO PCN reaction that required hospitalization NO PCN reaction occurring within the last 10 years: NO If all of the above answers are "NO", then may proceed with Cephalosporin use.  . Sulfa Antibiotics Hives and Swelling    SWELLING REACTION UNSPECIFIED   :  Family History  Problem Relation Age of  Onset  . Colon cancer Paternal Grandmother   . Cancer Paternal Grandmother     colon  . Cancer Mother     breast  . Hypertension Father   . Asthma Father   . Diabetes Brother   . Ovarian cancer Paternal Aunt   . Ovarian cancer Maternal Aunt   :  Social History   Social History  . Marital status: Married    Spouse name: N/A  . Number of children: N/A  . Years of education: N/A   Occupational History  . Not on file.   Social History Main Topics  . Smoking status: Former Smoker    Quit date: 08/03/2012  . Smokeless tobacco: Never Used  . Alcohol use 2.4 oz/week    4 Glasses of wine per week  . Drug use: No  . Sexual activity: Yes    Birth control/ protection: Post-menopausal  Other Topics Concern  . Not on file   Social History Narrative  . No narrative on file  :  Pertinent items are noted in HPI.  Exam: '@IPVITALS' @  Well-developed and well-nourished white female in no obvious distress. Vital signs show a temperature of 97.7. Pulse 74. Blood pressure 145/87. Weight is 148 pounds. Head and neck exam shows no ocular or oral lesions. There are no palpable cervical or supraclavicular lymph nodes. Lungs are clear bilaterally. Cardiac exam regular rate and rhythm with no murmurs, rubs or bruits. Breast exam shows right breast no masses, edema or erythema. There is no right axillary adenopathy. Left chest wall shows the nipple sparing mastectomy. she has an implant. There is some ecchymoses. There is some tenderness about the left ostectomy site. There is no left axillary adenopathy. Abdomen is soft. She has good bowel sounds. There is no fluid wave. There is no palpable liver or spleen tip. Back exam shows no tenderness over the spine, ribs or hips. Extremities shows no clubbing, cyanosis or edema. Skin exam shows no rashes, ecchymoses or petechia. Neurological exam shows no focal neurological deficits.   No results for input(s): WBC, HGB, HCT, PLT in the last 72 hours. No  results for input(s): NA, K, CL, CO2, GLUCOSE, BUN, CREATININE, CALCIUM in the last 72 hours.  Blood smear review:  None   Pathology: As above     Assessment and Plan:  Laura Landry is a very charming 59 year old postmenopausal white female. She has stage IIb invasive lobular carcinoma of the left breast. She had a nipple sparing mastectomy followed by immediate reconstruction.  She had 1 lymph node positive.  I think that her Oncotype score is quite high. Her Oncotype score is 26. I think that she would benefit from adjuvant chemotherapy. I realize that the benefit might not be all that great but yet I think given her young age and the fact that she is in good shape, there would be a benefit.  I talked to her and her husband about this. I think that she could tolerate Taxotere/Cytoxan. I think that 4 cycles would be appropriate for her.  She will need to have a Port-A-Cath placed. I'll see Dr. Donne Hazel can do this for [place].  I went over the side effects of treatment. Unfortunately, she will lose her hair. She has very nice hair but I know that it will come back when she finishes treatment.  I talked her about the risk of infection because of neutropenia. She will need to have Neulasta. I told her about fatigue, nausea and vomiting, possibly diarrhea. I don't think she would have to be transfused.  I think overall, she should go tolerate this okay.  I will know she will need any radiation therapy. Looks like the margins are very close. As such, radiation might be something that will be incorporated. I will let the radiation oncologist think about this.  I spent about an hour and 20 minutes with she and her husband. They're both very nice. She is obviously very well educated. She has done a lot of research.  Unfortunately, I'm not sure that she will ever go for antiestrogen therapy after chemotherapy. This might be another reason why chemotherapy will be helpful. She has very little problem  taking chemotherapy but has a lot of problem with tamoxifen or aromatase inhibitor therapy.  We will have to make sure she gets into the chemotherapy education class. She already had information sheets about the chemotherapy protocol.  Hopefully, we will be started on October 11.  I'll plan to see her back when she starts her second cycle of treatment.

## 2016-05-01 NOTE — Progress Notes (Signed)
START ON PATHWAY REGIMEN - Breast  BOS174: TC - Docetaxel, Cyclophosphamide q21 Days x 4 Cycles   A cycle is every 21 days:     Docetaxel (Taxotere(R)) 75 mg/m2 in 250 mL NS IV over 1 hour, q21 days; followed by Dose Mod: None     Cyclophosphamide (Cytoxan(R)) 600 mg/m2 in 250 mL NS IV over 30 minutes, q21 days Dose Mod: None Additional Orders: Premedicate with dexamethasone 8 mg PO bid for three days beginning 1 day prior to therapy  **Always confirm dose/schedule in your pharmacy ordering system**    Patient Characteristics: Adjuvant Therapy, Node Positive (1-3), HER2/neu Negative/Unknown/Equivocal, ER Positive, Oncotyping Ordered, Oncotype Intermediate Risk (18 - 30), Chemotherapy Preferred AJCC Stage Grouping: IIB Current Disease Status: No Distant Mets or Local Recurrence AJCC M Stage: 0 ER Status: Positive (+) AJCC N Stage: 1a AJCC T Stage: 2 HER2/neu: Negative (-) PR Status: Negative (-) Node Status: Positive (+) (1-3 Nodes) Has this patient completed genomic testing? Yes - Oncotype DX(R) Oncotype Recurrence Score: 26 Treatment Preferred: Chemotherapy  Intent of Therapy: Curative Intent, Discussed with Patient

## 2016-05-04 ENCOUNTER — Other Ambulatory Visit: Payer: Self-pay

## 2016-05-04 ENCOUNTER — Other Ambulatory Visit: Payer: BLUE CROSS/BLUE SHIELD

## 2016-05-04 DIAGNOSIS — E038 Other specified hypothyroidism: Secondary | ICD-10-CM

## 2016-05-05 ENCOUNTER — Other Ambulatory Visit: Payer: Self-pay | Admitting: General Surgery

## 2016-05-05 ENCOUNTER — Other Ambulatory Visit (INDEPENDENT_AMBULATORY_CARE_PROVIDER_SITE_OTHER): Payer: BLUE CROSS/BLUE SHIELD

## 2016-05-05 DIAGNOSIS — E038 Other specified hypothyroidism: Secondary | ICD-10-CM

## 2016-05-06 ENCOUNTER — Encounter (HOSPITAL_COMMUNITY): Payer: Self-pay | Admitting: *Deleted

## 2016-05-06 ENCOUNTER — Other Ambulatory Visit: Payer: BLUE CROSS/BLUE SHIELD

## 2016-05-06 ENCOUNTER — Encounter: Payer: Self-pay | Admitting: *Deleted

## 2016-05-06 ENCOUNTER — Ambulatory Visit: Payer: BLUE CROSS/BLUE SHIELD | Admitting: Oncology

## 2016-05-06 ENCOUNTER — Institutional Professional Consult (permissible substitution): Payer: BLUE CROSS/BLUE SHIELD | Admitting: Radiation Oncology

## 2016-05-06 ENCOUNTER — Ambulatory Visit: Payer: BLUE CROSS/BLUE SHIELD

## 2016-05-06 LAB — T3, FREE: T3, Free: 2.3 pg/mL (ref 2.3–4.2)

## 2016-05-06 LAB — TSH: TSH: 6.71 m[IU]/L — ABNORMAL HIGH

## 2016-05-06 LAB — T4, FREE: FREE T4: 0.9 ng/dL (ref 0.8–1.8)

## 2016-05-07 ENCOUNTER — Encounter (HOSPITAL_COMMUNITY)
Admission: RE | Disposition: A | Discharge status: Home / Self Care | Payer: Self-pay | Source: Ambulatory Visit | Attending: General Surgery

## 2016-05-07 ENCOUNTER — Ambulatory Visit (HOSPITAL_COMMUNITY)
Admission: RE | Admit: 2016-05-07 | Discharge: 2016-05-07 | Disposition: A | Discharge status: Home / Self Care | Payer: BLUE CROSS/BLUE SHIELD | Source: Ambulatory Visit | Attending: General Surgery | Admitting: General Surgery

## 2016-05-07 ENCOUNTER — Other Ambulatory Visit: Payer: BLUE CROSS/BLUE SHIELD

## 2016-05-07 ENCOUNTER — Ambulatory Visit (HOSPITAL_COMMUNITY): Payer: BLUE CROSS/BLUE SHIELD

## 2016-05-07 ENCOUNTER — Ambulatory Visit (HOSPITAL_COMMUNITY): Payer: BLUE CROSS/BLUE SHIELD | Admitting: Anesthesiology

## 2016-05-07 ENCOUNTER — Encounter (HOSPITAL_COMMUNITY): Payer: Self-pay | Admitting: Surgery

## 2016-05-07 ENCOUNTER — Encounter: Payer: BLUE CROSS/BLUE SHIELD | Admitting: Genetic Counselor

## 2016-05-07 DIAGNOSIS — Z8041 Family history of malignant neoplasm of ovary: Secondary | ICD-10-CM | POA: Insufficient documentation

## 2016-05-07 DIAGNOSIS — C50312 Malignant neoplasm of lower-inner quadrant of left female breast: Secondary | ICD-10-CM | POA: Insufficient documentation

## 2016-05-07 DIAGNOSIS — Z87891 Personal history of nicotine dependence: Secondary | ICD-10-CM | POA: Insufficient documentation

## 2016-05-07 DIAGNOSIS — Z419 Encounter for procedure for purposes other than remedying health state, unspecified: Secondary | ICD-10-CM

## 2016-05-07 DIAGNOSIS — F419 Anxiety disorder, unspecified: Secondary | ICD-10-CM | POA: Diagnosis not present

## 2016-05-07 DIAGNOSIS — E039 Hypothyroidism, unspecified: Secondary | ICD-10-CM | POA: Diagnosis not present

## 2016-05-07 DIAGNOSIS — J449 Chronic obstructive pulmonary disease, unspecified: Secondary | ICD-10-CM | POA: Diagnosis not present

## 2016-05-07 DIAGNOSIS — Z803 Family history of malignant neoplasm of breast: Secondary | ICD-10-CM | POA: Diagnosis not present

## 2016-05-07 DIAGNOSIS — Z79899 Other long term (current) drug therapy: Secondary | ICD-10-CM | POA: Diagnosis not present

## 2016-05-07 DIAGNOSIS — Z95828 Presence of other vascular implants and grafts: Secondary | ICD-10-CM

## 2016-05-07 HISTORY — PX: PORTACATH PLACEMENT: SHX2246

## 2016-05-07 HISTORY — DX: Nausea with vomiting, unspecified: R11.2

## 2016-05-07 HISTORY — DX: Other specified postprocedural states: Z98.890

## 2016-05-07 LAB — BASIC METABOLIC PANEL
ANION GAP: 7 (ref 5–15)
BUN: 7 mg/dL (ref 6–20)
CALCIUM: 9.9 mg/dL (ref 8.9–10.3)
CHLORIDE: 110 mmol/L (ref 101–111)
CO2: 24 mmol/L (ref 22–32)
CREATININE: 0.78 mg/dL (ref 0.44–1.00)
GFR calc non Af Amer: >60 mL/min (ref >60)
Glucose, Bld: 91 mg/dL (ref 65–99)
Potassium: 4.2 mmol/L (ref 3.5–5.1)
SODIUM: 141 mmol/L (ref 135–145)

## 2016-05-07 LAB — CBC
HCT: 43.8 % (ref 36.0–46.0)
HEMOGLOBIN: 13.9 g/dL (ref 12.0–15.0)
MCH: 32.3 pg (ref 26.0–34.0)
MCHC: 31.7 g/dL (ref 30.0–36.0)
MCV: 101.9 fL — AB (ref 78.0–100.0)
PLATELETS: 242 10*3/uL (ref 150–400)
RBC: 4.3 MIL/uL (ref 3.87–5.11)
RDW: 14 % (ref 11.5–15.5)
WBC: 8.4 10*3/uL (ref 4.0–10.5)

## 2016-05-07 SURGERY — INSERTION, TUNNELED CENTRAL VENOUS DEVICE, WITH PORT
Anesthesia: General

## 2016-05-07 MED ORDER — ONDANSETRON HCL 4 MG/2ML IJ SOLN
INTRAMUSCULAR | Status: DC | PRN
  Administered 2016-05-07: 4 mg via INTRAVENOUS

## 2016-05-07 MED ORDER — MIDAZOLAM HCL 2 MG/2ML IJ SOLN: 0.5000 mg | Freq: Once | INTRAMUSCULAR | Status: DC | PRN

## 2016-05-07 MED ORDER — FENTANYL CITRATE (PF) 100 MCG/2ML IJ SOLN: 25.0000 ug | INTRAMUSCULAR | Status: DC | PRN

## 2016-05-07 MED ORDER — MIDAZOLAM HCL 2 MG/2ML IJ SOLN
INTRAMUSCULAR | Status: AC
  Filled 2016-05-07: qty 2

## 2016-05-07 MED ORDER — SODIUM CHLORIDE 0.9 % IV SOLN: 250.0000 mL | INTRAVENOUS | Status: DC | PRN

## 2016-05-07 MED ORDER — DEXAMETHASONE SODIUM PHOSPHATE 10 MG/ML IJ SOLN
INTRAMUSCULAR | Status: DC | PRN
  Administered 2016-05-07: 10 mg via INTRAVENOUS

## 2016-05-07 MED ORDER — PROMETHAZINE HCL 25 MG/ML IJ SOLN: 6.2500 mg | INTRAMUSCULAR | Status: DC | PRN

## 2016-05-07 MED ORDER — DEXAMETHASONE SODIUM PHOSPHATE 10 MG/ML IJ SOLN
INTRAMUSCULAR | Status: AC
  Filled 2016-05-07: qty 1

## 2016-05-07 MED ORDER — MORPHINE SULFATE (PF) 2 MG/ML IV SOLN: 2.0000 mg | INTRAVENOUS | Status: DC | PRN

## 2016-05-07 MED ORDER — FENTANYL CITRATE (PF) 100 MCG/2ML IJ SOLN
INTRAMUSCULAR | Status: AC
  Filled 2016-05-07: qty 2

## 2016-05-07 MED ORDER — SODIUM CHLORIDE 0.9% FLUSH: 3.0000 mL | Freq: Two times a day (BID) | INTRAVENOUS | Status: DC

## 2016-05-07 MED ORDER — LACTATED RINGERS IV SOLN
INTRAVENOUS | Status: DC
Start: 2016-05-07 — End: 2016-05-07
  Administered 2016-05-07: 11:00:00 via INTRAVENOUS

## 2016-05-07 MED ORDER — LACTATED RINGERS IV SOLN
INTRAVENOUS | Status: DC | PRN
  Administered 2016-05-07: 12:00:00 via INTRAVENOUS

## 2016-05-07 MED ORDER — SODIUM CHLORIDE 0.9 % IV SOLN: INTRAVENOUS | Status: DC

## 2016-05-07 MED ORDER — MEPERIDINE HCL 25 MG/ML IJ SOLN: 6.2500 mg | INTRAMUSCULAR | Status: DC | PRN

## 2016-05-07 MED ORDER — BUPIVACAINE HCL (PF) 0.25 % IJ SOLN
INTRAMUSCULAR | Status: DC | PRN
  Administered 2016-05-07: 7 mL

## 2016-05-07 MED ORDER — HEPARIN SOD (PORK) LOCK FLUSH 100 UNIT/ML IV SOLN
INTRAVENOUS | Status: DC | PRN
  Administered 2016-05-07: 500 [IU]

## 2016-05-07 MED ORDER — ACETAMINOPHEN 650 MG RE SUPP: 650.0000 mg | RECTAL | Status: DC | PRN

## 2016-05-07 MED ORDER — 0.9 % SODIUM CHLORIDE (POUR BTL) OPTIME
TOPICAL | Status: DC | PRN
  Administered 2016-05-07: 1000 mL

## 2016-05-07 MED ORDER — BUPIVACAINE-EPINEPHRINE (PF) 0.25% -1:200000 IJ SOLN
INTRAMUSCULAR | Status: AC
  Filled 2016-05-07: qty 30

## 2016-05-07 MED ORDER — SODIUM CHLORIDE 0.9% FLUSH: 3.0000 mL | INTRAVENOUS | Status: DC | PRN

## 2016-05-07 MED ORDER — MIDAZOLAM HCL 5 MG/5ML IJ SOLN
INTRAMUSCULAR | Status: DC | PRN
  Administered 2016-05-07: 2 mg via INTRAVENOUS

## 2016-05-07 MED ORDER — PROPOFOL 10 MG/ML IV BOLUS
INTRAVENOUS | Status: DC | PRN
  Administered 2016-05-07: 150 mg via INTRAVENOUS
  Administered 2016-05-07: 20 mg via INTRAVENOUS

## 2016-05-07 MED ORDER — LIDOCAINE HCL (CARDIAC) 20 MG/ML IV SOLN
INTRAVENOUS | Status: DC | PRN
  Administered 2016-05-07: 40 mg via INTRAVENOUS

## 2016-05-07 MED ORDER — ONDANSETRON HCL 4 MG/2ML IJ SOLN
INTRAMUSCULAR | Status: AC
  Filled 2016-05-07: qty 2

## 2016-05-07 MED ORDER — FENTANYL CITRATE (PF) 100 MCG/2ML IJ SOLN
INTRAMUSCULAR | Status: DC | PRN
  Administered 2016-05-07: 25 ug via INTRAVENOUS
  Administered 2016-05-07: 50 ug via INTRAVENOUS
  Administered 2016-05-07: 25 ug via INTRAVENOUS

## 2016-05-07 MED ORDER — ACETAMINOPHEN 325 MG PO TABS: 650.0000 mg | ORAL_TABLET | ORAL | Status: DC | PRN

## 2016-05-07 MED ORDER — SODIUM CHLORIDE 0.9 % IV SOLN
INTRAVENOUS | Status: DC | PRN
  Administered 2016-05-07: 500 mL

## 2016-05-07 MED ORDER — OXYCODONE HCL 5 MG PO TABS: 5.0000 mg | ORAL_TABLET | ORAL | Status: DC | PRN

## 2016-05-07 MED ORDER — HEPARIN SOD (PORK) LOCK FLUSH 100 UNIT/ML IV SOLN
INTRAVENOUS | Status: AC
  Filled 2016-05-07: qty 5

## 2016-05-07 MED ORDER — LIDOCAINE 2% (20 MG/ML) 5 ML SYRINGE
INTRAMUSCULAR | Status: AC
  Filled 2016-05-07: qty 5

## 2016-05-07 SURGICAL SUPPLY — 56 items
ADH SKN CLS APL DERMABOND .7 (GAUZE/BANDAGES/DRESSINGS) ×1
BAG DECANTER FOR FLEXI CONT (MISCELLANEOUS) ×3 IMPLANT
BLADE SURG 11 STRL SS (BLADE) ×3 IMPLANT
BLADE SURG 15 STRL LF DISP TIS (BLADE) ×1 IMPLANT
BLADE SURG 15 STRL SS (BLADE) ×3
CHLORAPREP W/TINT 26ML (MISCELLANEOUS) ×3 IMPLANT
COVER SURGICAL LIGHT HANDLE (MISCELLANEOUS) ×3 IMPLANT
COVER TRANSDUCER ULTRASND GEL (DRAPE) ×3 IMPLANT
CRADLE DONUT ADULT HEAD (MISCELLANEOUS) ×3 IMPLANT
DECANTER SPIKE VIAL GLASS SM (MISCELLANEOUS) ×3 IMPLANT
DERMABOND ADVANCED (GAUZE/BANDAGES/DRESSINGS) ×2
DERMABOND ADVANCED .7 DNX12 (GAUZE/BANDAGES/DRESSINGS) IMPLANT
DRAPE C-ARM 42X72 X-RAY (DRAPES) ×3 IMPLANT
DRAPE CHEST BREAST 15X10 FENES (DRAPES) ×3 IMPLANT
ELECT CAUTERY BLADE 6.4 (BLADE) ×3 IMPLANT
ELECT REM PT RETURN 9FT ADLT (ELECTROSURGICAL) ×3
ELECTRODE REM PT RTRN 9FT ADLT (ELECTROSURGICAL) ×1 IMPLANT
GAUZE SPONGE 4X4 16PLY XRAY LF (GAUZE/BANDAGES/DRESSINGS) ×3 IMPLANT
GEL ULTRASOUND 20GR AQUASONIC (MISCELLANEOUS) IMPLANT
GLOVE BIO SURGEON STRL SZ 6.5 (GLOVE) ×1 IMPLANT
GLOVE BIO SURGEON STRL SZ7 (GLOVE) ×5 IMPLANT
GLOVE BIO SURGEONS STRL SZ 6.5 (GLOVE) ×1
GLOVE BIOGEL PI IND STRL 6.5 (GLOVE) IMPLANT
GLOVE BIOGEL PI IND STRL 7.0 (GLOVE) IMPLANT
GLOVE BIOGEL PI IND STRL 7.5 (GLOVE) ×1 IMPLANT
GLOVE BIOGEL PI INDICATOR 6.5 (GLOVE) ×2
GLOVE BIOGEL PI INDICATOR 7.0 (GLOVE) ×4
GLOVE BIOGEL PI INDICATOR 7.5 (GLOVE) ×2
GOWN STRL REUS W/ TWL LRG LVL3 (GOWN DISPOSABLE) ×2 IMPLANT
GOWN STRL REUS W/TWL LRG LVL3 (GOWN DISPOSABLE) ×6
INTRODUCER COOK 11FR (CATHETERS) IMPLANT
KIT BASIN OR (CUSTOM PROCEDURE TRAY) ×3 IMPLANT
KIT PORT POWER 8FR ISP CVUE (Catheter) ×2 IMPLANT
KIT ROOM TURNOVER OR (KITS) ×3 IMPLANT
LIQUID BAND (GAUZE/BANDAGES/DRESSINGS) ×3 IMPLANT
NDL HYPO 25GX1X1/2 BEV (NEEDLE) ×1 IMPLANT
NEEDLE HYPO 25GX1X1/2 BEV (NEEDLE) ×3 IMPLANT
NS IRRIG 1000ML POUR BTL (IV SOLUTION) ×3 IMPLANT
PACK SURGICAL SETUP 50X90 (CUSTOM PROCEDURE TRAY) ×3 IMPLANT
PAD ARMBOARD 7.5X6 YLW CONV (MISCELLANEOUS) ×6 IMPLANT
PENCIL BUTTON HOLSTER BLD 10FT (ELECTRODE) ×3 IMPLANT
SET INTRODUCER 12FR PACEMAKER (SHEATH) IMPLANT
SET SHEATH INTRODUCER 10FR (MISCELLANEOUS) IMPLANT
SHEATH COOK PEEL AWAY SET 9F (SHEATH) IMPLANT
STAPLER VISISTAT 35W (STAPLE) ×3 IMPLANT
SUT MNCRL AB 4-0 PS2 18 (SUTURE) ×3 IMPLANT
SUT PROLENE 2 0 SH DA (SUTURE) ×3 IMPLANT
SUT SILK 2 0 (SUTURE)
SUT SILK 2-0 18XBRD TIE 12 (SUTURE) IMPLANT
SUT VIC AB 3-0 SH 27 (SUTURE) ×3
SUT VIC AB 3-0 SH 27XBRD (SUTURE) ×1 IMPLANT
SYR 20ML ECCENTRIC (SYRINGE) ×6 IMPLANT
SYR 5ML LUER SLIP (SYRINGE) ×3 IMPLANT
SYR CONTROL 10ML LL (SYRINGE) IMPLANT
TOWEL OR 17X24 6PK STRL BLUE (TOWEL DISPOSABLE) ×3 IMPLANT
TOWEL OR 17X26 10 PK STRL BLUE (TOWEL DISPOSABLE) ×3 IMPLANT

## 2016-05-07 NOTE — Anesthesia Postprocedure Evaluation (Signed)
Anesthesia Post Note  Patient: Laura Landry  Procedure(s) Performed: Procedure(s) (LRB): INSERTION PORT-A-CATH WITH [place] (N/A)  Patient location during evaluation: PACU Anesthesia Type: General Level of consciousness: awake and alert, oriented and patient cooperative Pain management: pain level controlled Vital Signs Assessment: post-procedure vital signs reviewed and stable Respiratory status: spontaneous breathing, nonlabored ventilation and respiratory function stable Cardiovascular status: blood pressure returned to baseline and stable Postop Assessment: no signs of nausea or vomiting Anesthetic complications: no    Last Vitals:  Vitals:   05/07/16 1416 05/07/16 1421  BP: 137/90 (!) 141/94  Pulse: 75 64  Resp: 12 16  Temp: 36.7 C     Last Pain:  Vitals:   05/07/16 1421  TempSrc:   PainSc: 0-No pain                 Kristine Chahal,E. Nataleigh Griffin

## 2016-05-07 NOTE — Anesthesia Preprocedure Evaluation (Addendum)
Anesthesia Evaluation  Patient identified by MRN, date of birth, ID band Patient awake    Reviewed: Allergy & Precautions, NPO status , Patient's Chart, lab work & pertinent test results  History of Anesthesia Complications (+) PONV and history of anesthetic complications  Airway Mallampati: I  TM Distance: >3 FB Neck ROM: Full    Dental  (+) Dental Advisory Given   Pulmonary COPD,  COPD inhaler, former smoker (quit 2014),    breath sounds clear to auscultation       Cardiovascular (-) anginanegative cardio ROS   Rhythm:Regular Rate:Normal     Neuro/Psych negative neurological ROS     GI/Hepatic negative GI ROS, Neg liver ROS,   Endo/Other  Hypothyroidism   Renal/GU negative Renal ROS     Musculoskeletal   Abdominal   Peds  Hematology negative hematology ROS (+)   Anesthesia Other Findings Breast cancer  Reproductive/Obstetrics                            Anesthesia Physical Anesthesia Plan  ASA: II  Anesthesia Plan: General   Post-op Pain Management:    Induction: Intravenous  Airway Management Planned: LMA  Additional Equipment:   Intra-op Plan:   Post-operative Plan:   Informed Consent: I have reviewed the patients History and Physical, chart, labs and discussed the procedure including the risks, benefits and alternatives for the proposed anesthesia with the patient or authorized representative who has indicated his/her understanding and acceptance.   Dental advisory given  Plan Discussed with: CRNA and Surgeon  Anesthesia Plan Comments: (Plan routine monitors, GA- LMA OK)        Anesthesia Quick Evaluation

## 2016-05-07 NOTE — Op Note (Signed)
Preoperative diagnosis: breast cancer need for venous access Postoperative diagnosis: same as above Procedure: right ij US guided powerport insertion Surgeon: Dr Serita Grammes EBL: minimal Anes: general  Specimens none Complications none Drains none Sponge count correct Dispo to pacu stable  Indications: This is a 72 yof with high oncotype breast cancer that is node positive s/p mastectomy. She is agreeable to begin systemic chemotherapy and we discussed port placement.   Procedure: After informed consent was obtained the patient was taken to the operating room.Sequential compression devices were on her legs. She was then placed under general anesthesia with an LMA. Then she was prepped and draped in the standard sterile surgical fashion. Surgical timeout was then performed.  I used the ultrasound to identify the right internal jugular vein. I then accessed the vein using the ultrasound. This aspirated blood. I then placed the wire.This was confirmed by fluoroscopy and ultrasound to be in the correct position. I created a pocket on the right chest.. I tunneled the line between the 2 sites. I then dilated the tract and placed the dilator assembly with the sheath. This was done under fluoroscopy. I then removed the sheath and dilator. The wire was also removed. The line was then pulled back to be in the venacava. I hooked this up to the port. I sutured this into place with 2-0 Prolene in 2 places. This aspirated blood and flushed easily.This was confirmed with a final fluoroscopy. I then closed this with 2-0 Vicryl and 4-0 Monocryl. Dermabond was placed on both the incisions.A dressing was placed. She tolerated this well and was transferred to the recovery room in stable condition.

## 2016-05-07 NOTE — H&P (View-Only) (Signed)
60 yof who is here with her husband Artie for recent diagnosis of new left breast cancer. She is seen in consultation from Dr Gunnar Bulla Magrinat. She underwent screening mm that shows a left breast mass with skin retraction. the mass is 2 cm in size by mm and 2.4 cm by [place]. [place] of her axilla is negative. breast density is C. she underwent core biopsy that shows a grade II ILC that is er pos, pr neg, Her 2 negative and Ki is 30%. she has significant family history of breast cancer in her mom and two aunts with ovarian cancer. she has no complaints referable to either breast with mass or dc. she has noted now since made aware it feels like her skin is pulled in at 6 oclock in the left breast.    Other Problems Conni Slipper, RN; 03/18/2016 8:09 AM) Anxiety Disorder Lump In Breast Thyroid Disease  Past Surgical History Conni Slipper, RN; 03/18/2016 8:09 AM) Breast Biopsy Left.  Diagnostic Studies History Conni Slipper, RN; 03/18/2016 8:09 AM) Colonoscopy 1-5 years ago Mammogram within last year Pap Smear 1-5 years ago  Medication History Conni Slipper, RN; 03/18/2016 8:09 AM) Medications Reconciled  Social History Conni Slipper, RN; 03/18/2016 8:09 AM) Alcohol use Moderate alcohol use. Caffeine use Coffee. No drug use Tobacco use Former smoker.  Family History Conni Slipper, RN; 03/18/2016 8:09 AM) Arthritis Father. Breast Cancer Mother. Colon Cancer Family Members In General. Diabetes Mellitus Brother. Hypertension Father. Ischemic Bowel Disease Family Members In General. Kidney Disease Mother. Ovarian Cancer Family Members In General. Thyroid problems Family Members In General, Mother.  Pregnancy / Birth History Conni Slipper, RN; 03/18/2016 8:09 AM) Age at menarche 60 years. Age of menopause 51-55 Contraceptive History Oral contraceptives. Gravida 2 Length (months) of breastfeeding 7-12 Maternal age 16-25 Para 2  Review of Systems Conni Slipper RN; 03/18/2016  8:09 AM) General Present- Fatigue and Night Sweats. Not Present- Appetite Loss, Chills, Fever, Weight Gain and Weight Loss. Skin Not Present- Change in Wart/Mole, Dryness, Hives, Jaundice, New Lesions, Non-Healing Wounds, Rash and Ulcer. HEENT Present- Hearing Loss, Seasonal Allergies and Wears glasses/contact lenses. Not Present- Earache, Hoarseness, Nose Bleed, Oral Ulcers, Ringing in the Ears, Sinus Pain, Sore Throat, Visual Disturbances and Yellow Eyes. Respiratory Not Present- Bloody sputum, Chronic Cough, Difficulty Breathing, Snoring and Wheezing. Breast Present- Breast Mass. Not Present- Breast Pain, Nipple Discharge and Skin Changes. Cardiovascular Not Present- Chest Pain, Difficulty Breathing Lying Down, Leg Cramps, Palpitations, Rapid Heart Rate, Shortness of Breath and Swelling of Extremities. Gastrointestinal Present- Bloating. Not Present- Abdominal Pain, Bloody Stool, Change in Bowel Habits, Chronic diarrhea, Constipation, Difficulty Swallowing, Excessive gas, Gets full quickly at meals, Hemorrhoids, Indigestion, Nausea, Rectal Pain and Vomiting. Female Genitourinary Not Present- Frequency, Nocturia, Painful Urination, Pelvic Pain and Urgency. Musculoskeletal Not Present- Back Pain, Joint Pain, Joint Stiffness, Muscle Pain, Muscle Weakness and Swelling of Extremities. Neurological Not Present- Decreased Memory, Fainting, Headaches, Numbness, Seizures, Tingling, Tremor, Trouble walking and Weakness. Psychiatric Not Present- Anxiety, Bipolar, Change in Sleep Pattern, Depression, Fearful and Frequent crying. Endocrine Not Present- Cold Intolerance, Excessive Hunger, Hair Changes, Heat Intolerance, Hot flashes and New Diabetes. Hematology Not Present- Blood Thinners, Easy Bruising, Excessive bleeding, Gland problems, HIV and Persistent Infections.   Physical Exam Rolm Bookbinder MD; 03/18/2016 4:02 PM) General Mental Status-Alert. Orientation-Oriented  X3.  Eye Sclera/Conjunctiva - Bilateral-No scleral icterus.  Chest and Lung Exam Chest and lung exam reveals -quiet, even and easy respiratory effort with no use of accessory muscles and  on auscultation, normal breath sounds, no adventitious sounds and normal vocal resonance.  Breast Nipples-No Discharge. Note: no right breast mass, left breast at six oclock with 2 cm hematoma/mass and overlying skin retraction   Cardiovascular Cardiovascular examination reveals -normal heart sounds, regular rate and rhythm with no murmurs.  Lymphatic Head & Neck  General Head & Neck Lymphatics: Bilateral - Description - Normal. Axillary  General Axillary Region: Bilateral - Description - Normal. Note: no [place] adenopathy   Assessment & Plan Rolm Bookbinder MD; 03/30/2016 2:41 PM) BREAST CANCER OF LOWER-INNER QUADRANT OF LEFT FEMALE BREAST (C50.312) Story: Left nsm, left ax sn biopsy, reconstruction I think reasonable to consider nsm and will excise some skin inferiorly. we discussed that I think due to size and location that mastectomy is best. she is very interested in nsm. I discussed particular risks to this procedure including positive nipple margin necessitating excision, nipple necrosis either partial or total, skin necrosis, loss of nac sensation as well as bleeding, infection. I will have her see Dr Iran Planas and then proceed from there

## 2016-05-07 NOTE — Discharge Instructions (Signed)
    PORT-A-CATH: POST OP INSTRUCTIONS  Always review your discharge instruction sheet given to you by the facility where your surgery was performed.   1. A prescription for pain medication may be given to you upon discharge. Take your pain medication as prescribed, if needed. If narcotic pain medicine is not needed, then you make take acetaminophen (Tylenol) or ibuprofen (Advil) as needed.  2. Take your usually prescribed medications unless otherwise directed. 3. If you need a refill on your pain medication, please contact our office. All narcotic pain medicine now requires a paper prescription.  Phoned in and fax refills are no longer allowed by law.  Prescriptions will not be filled after 5 pm or on weekends.  4. You should follow a light diet for the remainder of the day after your procedure. 5. Most patients will experience some mild swelling and/or bruising in the area of the incision. It may take several days to resolve. 6. It is common to experience some constipation if taking pain medication after surgery. Increasing fluid intake and taking a stool softener (such as Colace) will usually help or prevent this problem from occurring. A mild laxative (Milk of Magnesia or Miralax) should be taken according to package directions if there are no bowel movements after 48 hours.  7. Unless discharge instructions indicate otherwise, you may remove your bandages 48 hours after surgery, and you may shower at that time. You may have steri-strips (small white skin tapes) in place directly over the incision.  These strips should be left on the skin for 7-10 days.  If your surgeon used Dermabond (skin glue) on the incision, you may shower in 24 hours.  The glue will flake off over the next 2-3 weeks.  8. If your port is left accessed at the end of surgery (needle left in port), the dressing cannot get wet and should only by changed by a healthcare professional. When the port is no longer accessed (when the  needle has been removed), follow step 7.   9. ACTIVITIES:  Limit activity involving your arms for the next 72 hours. Do no strenuous exercise or activity for 1 week. You may drive when you are no longer taking prescription pain medication, you can comfortably wear a seatbelt, and you can maneuver your car. 10.You may need to see your doctor in the office for a follow-up appointment.  Please       check with your doctor.  11.When you receive a new Port-a-Cath, you will get a product guide and        ID card.  Please keep them in case you need them.  WHEN TO CALL YOUR DOCTOR ([phone]): 1. Fever over 101.0 2. Chills 3. Continued bleeding from incision 4. Increased redness and tenderness at the site 5. Shortness of breath, difficulty breathing   The clinic staff is available to answer your questions during regular business hours. Please don't hesitate to call and ask to speak to one of the nurses or medical assistants for clinical concerns. If you have a medical emergency, go to the nearest emergency room or call 911.  A surgeon from [facility] is always on call at the hospital.     For further information, please visit [email or web address]      

## 2016-05-07 NOTE — Anesthesia Procedure Notes (Signed)
Procedure Name: LMA Insertion Date/Time: 05/07/2016 12:49 PM Performed by: Susa Loffler Pre-anesthesia Checklist: Patient identified, Emergency Drugs available, Suction available, Patient being monitored and Timeout performed Patient Re-evaluated:Patient Re-evaluated prior to inductionOxygen Delivery Method: Circle system utilized Preoxygenation: Pre-oxygenation with 100% oxygen Intubation Type: IV induction Ventilation: Mask ventilation without difficulty LMA: LMA inserted LMA Size: 4.0 Number of attempts: 1 Placement Confirmation: positive ETCO2 and breath sounds checked- equal and bilateral Tube secured with: Tape Dental Injury: Teeth and Oropharynx as per pre-operative assessment

## 2016-05-07 NOTE — Interval H&P Note (Signed)
History and Physical Interval Note:  05/07/2016 12:13 PM  Laura Landry  has presented today for surgery, with the diagnosis of BREAST CANCER  The various methods of treatment have been discussed with the patient and family. After consideration of risks, benefits and other options for treatment, the patient has consented to  Procedure(s): INSERTION PORT-A-CATH WITH [place] (N/A) as a surgical intervention .  The patient's history has been reviewed, patient examined, no change in status, stable for surgery.  I have reviewed the patient's chart and labs.  Questions were answered to the patient's satisfaction.     [name]

## 2016-05-07 NOTE — Transfer of Care (Signed)
Immediate Anesthesia Transfer of Care Note  Patient: Laura Landry  Procedure(s) Performed: Procedure(s): INSERTION PORT-A-CATH WITH [place] (N/A)  Patient Location: PACU  Anesthesia Type:General  Level of Consciousness: awake, alert  and oriented  Airway & Oxygen Therapy: Patient Spontanous Breathing and Patient connected to nasal cannula oxygen  Post-op Assessment: Report given to RN and Post -op Vital signs reviewed and stable  Post vital signs: Reviewed and stable  Last Vitals:  Vitals:   05/07/16 1016 05/07/16 1332  BP: (!) 156/81   Pulse: 79   Resp: 18   Temp: 36.8 C 36.4 C    Last Pain:  Vitals:   05/07/16 1332  TempSrc:   PainSc: 0-No pain      Patients Stated Pain Goal: 3 ([id number])  Complications: No apparent anesthesia complications

## 2016-05-08 ENCOUNTER — Encounter (HOSPITAL_COMMUNITY): Payer: Self-pay | Admitting: General Surgery

## 2016-05-11 ENCOUNTER — Ambulatory Visit (INDEPENDENT_AMBULATORY_CARE_PROVIDER_SITE_OTHER): Payer: BLUE CROSS/BLUE SHIELD | Admitting: Family Medicine

## 2016-05-11 ENCOUNTER — Other Ambulatory Visit: Payer: Self-pay | Admitting: *Deleted

## 2016-05-11 ENCOUNTER — Encounter: Payer: Self-pay | Admitting: Family Medicine

## 2016-05-11 VITALS — BP 150/93 | HR 72 | Temp 98.2°F | Ht 65.0 in | Wt 150.1 lb

## 2016-05-11 DIAGNOSIS — Z9114 Patient's other noncompliance with medication regimen: Secondary | ICD-10-CM

## 2016-05-11 DIAGNOSIS — E039 Hypothyroidism, unspecified: Secondary | ICD-10-CM | POA: Diagnosis not present

## 2016-05-11 DIAGNOSIS — I10 Essential (primary) hypertension: Secondary | ICD-10-CM

## 2016-05-11 DIAGNOSIS — F419 Anxiety disorder, unspecified: Secondary | ICD-10-CM | POA: Diagnosis not present

## 2016-05-11 DIAGNOSIS — Z87891 Personal history of nicotine dependence: Secondary | ICD-10-CM

## 2016-05-11 DIAGNOSIS — R5383 Other fatigue: Secondary | ICD-10-CM | POA: Insufficient documentation

## 2016-05-11 DIAGNOSIS — C50312 Malignant neoplasm of lower-inner quadrant of left female breast: Secondary | ICD-10-CM

## 2016-05-11 DIAGNOSIS — J3089 Other allergic rhinitis: Secondary | ICD-10-CM | POA: Insufficient documentation

## 2016-05-11 MED ORDER — LEVOTHYROXINE SODIUM 100 MCG PO TABS: 100.0000 ug | ORAL_TABLET | Freq: Every day | ORAL | 1 refills | Status: DC

## 2016-05-11 MED ORDER — PROCHLORPERAZINE MALEATE 10 MG PO TABS: 10.0000 mg | ORAL_TABLET | Freq: Four times a day (QID) | ORAL | 1 refills | Status: DC | PRN

## 2016-05-11 MED ORDER — LIDOCAINE-PRILOCAINE 2.5-2.5 % EX CREA: TOPICAL_CREAM | CUTANEOUS | 3 refills | Status: DC

## 2016-05-11 MED ORDER — LORAZEPAM 0.5 MG PO TABS: 0.5000 mg | ORAL_TABLET | Freq: Four times a day (QID) | ORAL | 0 refills | Status: DC | PRN

## 2016-05-11 MED ORDER — ONDANSETRON HCL 8 MG PO TABS: 8.0000 mg | ORAL_TABLET | Freq: Two times a day (BID) | ORAL | 1 refills | Status: DC | PRN

## 2016-05-11 MED ORDER — ALBUTEROL SULFATE HFA 108 (90 BASE) MCG/ACT IN AERS: 1.0000 | INHALATION_SPRAY | Freq: Four times a day (QID) | RESPIRATORY_TRACT | 0 refills | Status: DC | PRN

## 2016-05-11 NOTE — Progress Notes (Signed)
Impression and Recommendations:    1. Noncompliance with medication regimen   2. Hypothyroidism, unspecified type   3. Former smoker   4. Anxiety   5. Hypertension, benign essential, goal below 140/90   6. Malignant neoplasm of lower-inner quadrant of left female breast, unspecified estrogen receptor status ([place])   7. Environmental and seasonal allergies     Bp- suboptimally controlled. Pt declines meds once again. She says that her blood pressure is always well controlled at home and at other physician's offices. She understands risks associated with this and my recommendation that less than 140/90 on a regular basis is where her blood pressure needs to be. She will discuss with her oncologist   Refill Synthroid. Patient would like to hold off for now on her armor thyroid transition. She will discuss it with her endocrinologist ( MORAYATI, [facility] )  in the near future.  Patient will call for follow-up appointment.   Since she has been off her Synthroid and only taking it once every 3-4 days,   Pt will need to be recked With TSH, T3 and free T4 in 6-8 wks.    Refill given of albuterol- use prn only  -->        Health education performed. See AVS for further recommendations  Only new prescriptions I gave patient today was her albuterol inhaler and her levothyroxine----> see below Modified Medications   Modified Medication Previous Medication   ALBUTEROL (PROVENTIL HFA;VENTOLIN HFA) 108 (90 BASE) MCG/ACT INHALER albuterol (PROVENTIL HFA;VENTOLIN HFA) 108 (90 Base) MCG/ACT inhaler      Inhale 1-2 puffs into the lungs every 6 (six) hours as needed for wheezing or shortness of breath.    Inhale 1-2 puffs into the lungs every 6 (six) hours as needed for wheezing or shortness of breath.   LEVOTHYROXINE (SYNTHROID, LEVOTHROID) 100 MCG TABLET levothyroxine (SYNTHROID, LEVOTHROID) 100 MCG tablet      Take 1 tablet (100 mcg total) by mouth daily.    Take 1 tablet (100 mcg total) by  mouth daily.     Discontinued Medications   DOXYCYCLINE (VIBRAMYCIN) 50 MG CAPSULE    Take 1 capsule (50 mg total) by mouth 2 (two) times daily.   METHOCARBAMOL (ROBAXIN) 500 MG TABLET    Take 1 tablet (500 mg total) by mouth every 8 (eight) hours as needed for muscle spasms.   OXYCODONE (OXY IR/ROXICODONE) 5 MG IMMEDIATE RELEASE TABLET    Take 1-2 tablets (5-10 mg total) by mouth every 4 (four) hours as needed for moderate pain.   TAMOXIFEN (NOLVADEX) 10 MG TABLET    Take 1 tablet (10 mg total) by mouth 2 (two) times daily.    The patient was counseled, risk factors were discussed, anticipatory guidance given.  Gross side effects, risk and benefits, and alternatives of medications and treatment plan in general discussed with patient.  Patient is aware that all medications have potential side effects and we are unable to predict every side effect or drug-drug interaction that may occur.   Patient will call with any questions prior to using medication if they have concerns.  Expresses verbal understanding and consents to current therapy and treatment regimen.  No barriers to understanding were identified.  Red flag symptoms and signs discussed in detail.  Patient expressed understanding regarding what to do in case of emergency\urgent symptoms  Return in about 4 months (around 09/11/2016) for [place] if any problems or concerns.  Please see AVS handed out to  patient at the end of our visit for further patient instructions/ counseling done pertaining to today's office visit.    Note: This document was prepared using Dragon voice recognition software and may include unintentional dictation errors.   --------------------------------------------------------------------------------------------------------------------------------------------------------------------------------------------------------------------------------------------    Subjective:    CC:  Chief Complaint  Patient presents  with  . Hypothyroidism    HPI: Laura Landry is a 60 y.o. female who presents to [facility] today for issues as discussed below.   spring and fall allergies/ RAD to pollen/ dust etc.  Only occ uses albuterol inhaler once a month if outside- burning stuff in burn barrel.  Etc.    Pt has been not taking it regularly for 4 wks or so. Pt would like to change to armour thyroid b/c she did research on how iot would be better for her breast CA.    Needs flu shot.   HTN---  Still elevated- pt wants to wait to talk to her ONC Doc about it. Not wanting to change meds when dealing with her breat CA etc.    Wt Readings from Last 3 Encounters:  05/11/16 150 lb 1.6 oz (68.1 kg)  05/07/16 148 lb (67.1 kg)  05/01/16 148 lb (67.1 kg)   BP Readings from Last 3 Encounters:  05/11/16 (!) 150/93  05/07/16 (!) 141/94  05/01/16 (!) 145/87   Pulse Readings from Last 3 Encounters:  05/11/16 72  05/07/16 64  05/01/16 74   BMI Readings from Last 3 Encounters:  05/11/16 24.98 kg/m  05/07/16 24.63 kg/m  05/01/16 24.63 kg/m     Patient Active Problem List   Diagnosis Date Noted  . Anxiety 05/11/2016  . Fatigue 05/11/2016  . Environmental and seasonal allergies 05/11/2016  . Breast cancer ([place]) 04/14/2016  . Breast cancer of lower-inner quadrant of left female breast ([place]) 03/09/2016  . Former smoker 02/23/2016  . White coat syndrome without diagnosis of hypertension 02/23/2016  . Prehypertension 01/09/2016  . Hypothyroidism 01/08/2016  . Rhus dermatitis 01/07/2016    Past Medical history, Surgical history, Family history, Social history, Allergies and Medications have been entered into the medical record, reviewed and changed as needed.   Allergies:  Allergies  Allergen Reactions  . Clindamycin/Lincomycin Swelling    THROAT SWELLS  . Eggs Or Egg-Derived Products Swelling and Other (See Comments)    ABDOMINAL PAIN SWELLING, SWELLING REACTION  UNSPECIFIED    . Ciprofloxacin Rash  . Codeine Hives and Swelling    SWELLING REACTION UNSPECIFIED   . Nickel Other (See Comments)    BLISTERS   . Penicillins Hives, Nausea And Vomiting, Swelling and Rash    SWELLING REACTION UNSPECIFIED  Has patient had PCN reaction causing immediate rash, facial/tongue/throat swelling, SOB or lightheadedness with hypotension: YES PCN reaction causing severe rash involving mucus membranes or skin necrosis: NO PCN reaction that required hospitalization NO PCN reaction occurring within the last 10 years: NO If all of the above answers are "NO", then may proceed with Cephalosporin use.  . Sulfa Antibiotics Hives and Swelling    SWELLING REACTION UNSPECIFIED     Review of Systems  Constitutional: Negative.  Negative for chills, diaphoresis, fever, malaise/fatigue and weight loss.  HENT: Negative for congestion, sore throat and tinnitus.        Sinus associated  Eyes: Negative.  Negative for blurred vision, double vision and photophobia.  Respiratory: Negative.  Negative for cough and wheezing.   Cardiovascular: Negative.  Negative for chest pain and  palpitations.  Gastrointestinal: Negative.  Negative for blood in stool, diarrhea, nausea and vomiting.  Genitourinary: Negative.  Negative for dysuria, frequency and urgency.  Musculoskeletal: Positive for myalgias. Negative for joint pain.       Chest pains associated with recent mastectomy and port placement  Skin: Negative.  Negative for itching and rash.  Neurological: Positive for headaches. Negative for dizziness, focal weakness and weakness.  Endo/Heme/Allergies: Positive for environmental allergies. Negative for polydipsia. Bruises/bleeds easily.       Chronic problems  Psychiatric/Behavioral: Negative for depression and memory loss. The patient is nervous/anxious. The patient does not have insomnia.        Sporadic     Objective:  Initial blood pressure was 165/91- reck below  Blood  pressure (!) 150/93, pulse 72, temperature 98.2 F (36.8 C), temperature source Oral, height 5\' 5"  (1.651 m), weight 150 lb 1.6 oz (68.1 kg). Body mass index is 24.98 kg/m. General: Well Developed, well nourished, appropriate for stated age.  Neuro: Alert and oriented x3, extra-ocular muscles intact, sensation grossly intact.  HEENT: Normocephalic, atraumatic, neck supple   Skin: Warm and dry, no gross rash. Cardiac: RRR, S1 S2,  no murmurs rubs or gallops.  Respiratory: ECTA B/L, no crackles wheezes or rales.Not using accessory muscles, speaking in full sentences-unlabored. Vascular:  No gross lower ext edema, cap RF less 2 sec. Psych: No HI/SI, judgement and insight good, Euthymic mood. Full Affect.

## 2016-05-11 NOTE — Patient Instructions (Addendum)
Again reminded patient that she needs blood pressure medicines. Goal blood pressure should be under 140/90 on a regular basis. She declines meds. She will talk to her oncologist physician in the very near future and get his opinion on this.       Managing Your High Blood Pressure Blood pressure is a measurement of how forceful your blood is pressing against the walls of the arteries. Arteries are muscular tubes within the circulatory system. Blood pressure does not stay the same. Blood pressure rises when you are active, excited, or nervous; and it lowers during sleep and relaxation. If the numbers measuring your blood pressure stay above normal most of the time, you are at risk for health problems. High blood pressure (hypertension) is a long-term (chronic) condition in which blood pressure is elevated. A blood pressure reading is recorded as two numbers, such as 120 over 80 (or 120/80). The first, higher number is called the systolic pressure. It is a measure of the pressure in your arteries as the heart beats. The second, lower number is called the diastolic pressure. It is a measure of the pressure in your arteries as the heart relaxes between beats.  Keeping your blood pressure in a normal range is important to your overall health and prevention of health problems, such as heart disease and stroke. When your blood pressure is uncontrolled, your heart has to work harder than normal. High blood pressure is a very common condition in adults because blood pressure tends to rise with age. Men and women are equally likely to have hypertension but at different times in life. Before age 88, men are more likely to have hypertension. After 59 years of age, women are more likely to have it. Hypertension is especially common in African Americans. This condition often has no signs or symptoms. The cause of the condition is usually not known. Your caregiver can help you come up with a plan to keep your blood  pressure in a normal, healthy range. BLOOD PRESSURE STAGES Blood pressure is classified into four stages: normal, prehypertension, stage 1, and stage 2. Your blood pressure reading will be used to determine what type of treatment, if any, is necessary. Appropriate treatment options are tied to these four stages:  Normal  Systolic pressure (mm Hg): below 120.  Diastolic pressure (mm Hg): below 80. Prehypertension  Systolic pressure (mm Hg): 120 to 139.  Diastolic pressure (mm Hg): 80 to 89. Stage1  Systolic pressure (mm Hg): 140 to 159.  Diastolic pressure (mm Hg): 90 to 99. Stage2  Systolic pressure (mm Hg): 160 or above.  Diastolic pressure (mm Hg): 100 or above. RISKS RELATED TO HIGH BLOOD PRESSURE Managing your blood pressure is an important responsibility. Uncontrolled high blood pressure can lead to:  A heart attack.  A stroke.  A weakened blood vessel (aneurysm).  Heart failure.  Kidney damage.  Eye damage.  Metabolic syndrome.  Memory and concentration problems. HOW TO MANAGE YOUR BLOOD PRESSURE Blood pressure can be managed effectively with lifestyle changes and medicines (if needed). Your caregiver will help you come up with a plan to bring your blood pressure within a normal range. Your plan should include the following: Education  Read all information provided by your caregivers about how to control blood pressure.  Educate yourself on the latest guidelines and treatment recommendations. New research is always being done to further define the risks and treatments for high blood pressure. Lifestylechanges  Control your weight.  Avoid smoking.  Stay physically  active.  Reduce the amount of salt in your diet.  Reduce stress.  Control any chronic conditions, such as high cholesterol or diabetes.  Reduce your alcohol intake. Medicines  Several medicines (antihypertensive medicines) are available, if needed, to bring blood pressure within a  normal range. Communication  Review all the medicines you take with your caregiver because there may be side effects or interactions.  Talk with your caregiver about your diet, exercise habits, and other lifestyle factors that may be contributing to high blood pressure.  See your caregiver regularly. Your caregiver can help you create and adjust your plan for managing high blood pressure. RECOMMENDATIONS FOR TREATMENT AND FOLLOW-UP  The following recommendations are based on current guidelines for managing high blood pressure in nonpregnant adults. Use these recommendations to identify the proper follow-up period or treatment option based on your blood pressure reading. You can discuss these options with your caregiver.  Systolic pressure of 123456 to [id number] or diastolic pressure of 80 to 89: Follow up with your caregiver as directed.  Systolic pressure of [id number] to 0000000 or diastolic pressure of 90 to 100: Follow up with your caregiver within 2 months.  Systolic pressure above 0000000 or diastolic pressure above [id number]: Follow up with your caregiver within 1 month.  Systolic pressure above [id number] or diastolic pressure above [id number]: Consider antihypertensive therapy; follow up with your caregiver within 1 week.  Systolic pressure above [id number] or diastolic pressure above 123456: Begin antihypertensive therapy; follow up with your caregiver within 1 week.   This information is not intended to replace advice given to you by your health care provider. Make sure you discuss any questions you have with your health care provider.   Document Released: 04/13/2012 Document Reviewed: 04/13/2012 Elsevier Interactive Patient Education 2016 [employer].     Hypertension Hypertension, commonly called high blood pressure, is when the force of blood pumping through your arteries is too strong. Your arteries are the blood vessels that carry blood from your heart throughout your body. A blood pressure reading consists of a higher  number over a lower number, such as 110/72. The higher number (systolic) is the pressure inside your arteries when your heart pumps. The lower number (diastolic) is the pressure inside your arteries when your heart relaxes. Ideally you want your blood pressure below 120/80. Hypertension forces your heart to work harder to pump blood. Your arteries may become narrow or stiff. Having untreated or uncontrolled hypertension can cause heart attack, stroke, kidney disease, and other problems. RISK FACTORS Some risk factors for high blood pressure are controllable. Others are not.  Risk factors you cannot control include:   Race. You may be at higher risk if you are African American.  Age. Risk increases with age.  Gender. Men are at higher risk than women before age 40 years. After age 27, women are at higher risk than men. Risk factors you can control include:  Not getting enough exercise or physical activity.  Being overweight.  Getting too much fat, sugar, calories, or salt in your diet.  Drinking too much alcohol. SIGNS AND SYMPTOMS Hypertension does not usually cause signs or symptoms. Extremely high blood pressure (hypertensive crisis) may cause headache, anxiety, shortness of breath, and nosebleed. DIAGNOSIS To check if you have hypertension, your health care provider will measure your blood pressure while you are seated, with your arm held at the level of your heart. It should be measured at least twice using the same arm. Certain conditions can cause a  difference in blood pressure between your right and left arms. A blood pressure reading that is higher than normal on one occasion does not mean that you need treatment. If it is not clear whether you have high blood pressure, you may be asked to return on a different day to have your blood pressure checked again. Or, you may be asked to monitor your blood pressure at home for 1 or more weeks. TREATMENT Treating high blood pressure includes  making lifestyle changes and possibly taking medicine. Living a healthy lifestyle can help lower high blood pressure. You may need to change some of your habits. Lifestyle changes may include:  Following the DASH diet. This diet is high in fruits, vegetables, and whole grains. It is low in salt, red meat, and added sugars.  Keep your sodium intake below 2,300 mg per day.  Getting at least 30-45 minutes of aerobic exercise at least 4 times per week.  Losing weight if necessary.  Not smoking.  Limiting alcoholic beverages.  Learning ways to reduce stress. Your health care provider may prescribe medicine if lifestyle changes are not enough to get your blood pressure under control, and if one of the following is true:  You are 60-6 years of age and your systolic blood pressure is above 140.  You are 30 years of age or older, and your systolic blood pressure is above 150.  Your diastolic blood pressure is above 90.  You have diabetes, and your systolic blood pressure is over [id number] or your diastolic blood pressure is over 90.  You have kidney disease and your blood pressure is above 140/90.  You have heart disease and your blood pressure is above 140/90. Your personal target blood pressure may vary depending on your medical conditions, your age, and other factors. HOME CARE INSTRUCTIONS  Have your blood pressure rechecked as directed by your health care provider.   Take medicines only as directed by your health care provider. Follow the directions carefully. Blood pressure medicines must be taken as prescribed. The medicine does not work as well when you skip doses. Skipping doses also puts you at risk for problems.  Do not smoke.   Monitor your blood pressure at home as directed by your health care provider. SEEK MEDICAL CARE IF:   You think you are having a reaction to medicines taken.  You have recurrent headaches or feel dizzy.  You have swelling in your ankles.  You  have trouble with your vision. SEEK IMMEDIATE MEDICAL CARE IF:  You develop a severe headache or confusion.  You have unusual weakness, numbness, or feel faint.  You have severe chest or abdominal pain.  You vomit repeatedly.  You have trouble breathing. MAKE SURE YOU:   Understand these instructions.  Will watch your condition.  Will get help right away if you are not doing well or get worse.   This information is not intended to replace advice given to you by your health care provider. Make sure you discuss any questions you have with your health care provider.   Document Released: 07/20/2005 Document Revised: 12/04/2014 Document Reviewed: 05/12/2013 Elsevier Interactive Patient Education [employer].

## 2016-05-12 ENCOUNTER — Encounter: Payer: BLUE CROSS/BLUE SHIELD | Admitting: Genetic Counselor

## 2016-05-12 ENCOUNTER — Other Ambulatory Visit: Payer: BLUE CROSS/BLUE SHIELD

## 2016-05-13 ENCOUNTER — Ambulatory Visit (HOSPITAL_BASED_OUTPATIENT_CLINIC_OR_DEPARTMENT_OTHER): Payer: BLUE CROSS/BLUE SHIELD

## 2016-05-13 ENCOUNTER — Other Ambulatory Visit (HOSPITAL_BASED_OUTPATIENT_CLINIC_OR_DEPARTMENT_OTHER): Payer: BLUE CROSS/BLUE SHIELD

## 2016-05-13 VITALS — BP 140/89 | HR 59 | Temp 98.2°F | Resp 18

## 2016-05-13 DIAGNOSIS — C50312 Malignant neoplasm of lower-inner quadrant of left female breast: Secondary | ICD-10-CM | POA: Diagnosis not present

## 2016-05-13 DIAGNOSIS — Z5111 Encounter for antineoplastic chemotherapy: Secondary | ICD-10-CM

## 2016-05-13 LAB — CBC WITH DIFFERENTIAL (CANCER CENTER ONLY)
BASO#: 0 10*3/uL (ref 0.0–0.2)
BASO%: 0.1 % (ref 0.0–2.0)
EOS ABS: 0 10*3/uL (ref 0.0–0.5)
EOS%: 0 % (ref 0.0–7.0)
HCT: 40.3 % (ref 34.8–46.6)
HGB: 13.8 g/dL (ref 11.6–15.9)
LYMPH#: 1 10*3/uL (ref 0.9–3.3)
LYMPH%: 6.3 % — AB (ref 14.0–48.0)
MCH: 34.1 pg — AB (ref 26.0–34.0)
MCHC: 34.2 g/dL (ref 32.0–36.0)
MCV: 100 fL (ref 81–101)
MONO#: 1.2 10*3/uL — ABNORMAL HIGH (ref 0.1–0.9)
MONO%: 7.5 % (ref 0.0–13.0)
NEUT%: 86.1 % — ABNORMAL HIGH (ref 39.6–80.0)
NEUTROS ABS: 13.4 10*3/uL — AB (ref 1.5–6.5)
PLATELETS: 216 10*3/uL (ref 145–400)
RBC: 4.05 10*6/uL (ref 3.70–5.32)
RDW: 13.3 % (ref 11.1–15.7)
WBC: 15.5 10*3/uL — ABNORMAL HIGH (ref 3.9–10.0)

## 2016-05-13 LAB — CMP (CANCER CENTER ONLY)
ALT(SGPT): 19 U/L (ref 10–47)
AST: 20 U/L (ref 11–38)
Albumin: 4 g/dL (ref 3.3–5.5)
Alkaline Phosphatase: 55 U/L (ref 26–84)
BILIRUBIN TOTAL: 0.4 mg/dL (ref 0.20–1.60)
BUN: 7 mg/dL (ref 7–22)
CHLORIDE: 104 meq/L (ref 98–108)
CO2: 26 meq/L (ref 18–33)
CREATININE: 0.8 mg/dL (ref 0.6–1.2)
Calcium: 10 mg/dL (ref 8.0–10.3)
GLUCOSE: 113 mg/dL (ref 73–118)
Potassium: 3.9 mEq/L (ref 3.3–4.7)
SODIUM: 138 meq/L (ref 128–145)
Total Protein: 6.9 g/dL (ref 6.4–8.1)

## 2016-05-13 MED ORDER — SODIUM CHLORIDE 0.9 % IV SOLN
Freq: Once | INTRAVENOUS | Status: AC
  Administered 2016-05-13: 11:00:00 via INTRAVENOUS

## 2016-05-13 MED ORDER — SODIUM CHLORIDE 0.9% FLUSH
10.0000 mL | INTRAVENOUS | Status: DC | PRN
  Administered 2016-05-13: 10 mL
  Filled 2016-05-13: qty 10

## 2016-05-13 MED ORDER — PEGFILGRASTIM 6 MG/0.6ML ~~LOC~~ PSKT
6.0000 mg | PREFILLED_SYRINGE | Freq: Once | SUBCUTANEOUS | Status: AC
  Administered 2016-05-13: 6 mg via SUBCUTANEOUS

## 2016-05-13 MED ORDER — DOCETAXEL CHEMO INJECTION 160 MG/16ML
75.0000 mg/m2 | Freq: Once | INTRAVENOUS | Status: AC
  Administered 2016-05-13: 130 mg via INTRAVENOUS
  Filled 2016-05-13: qty 13

## 2016-05-13 MED ORDER — PEGFILGRASTIM 6 MG/0.6ML ~~LOC~~ PSKT
PREFILLED_SYRINGE | SUBCUTANEOUS | Status: AC
  Filled 2016-05-13: qty 0.6

## 2016-05-13 MED ORDER — SODIUM CHLORIDE 0.9 % IV SOLN
600.0000 mg/m2 | Freq: Once | INTRAVENOUS | Status: AC
  Administered 2016-05-13: 1060 mg via INTRAVENOUS
  Filled 2016-05-13: qty 53

## 2016-05-13 MED ORDER — PALONOSETRON HCL INJECTION 0.25 MG/5ML
INTRAVENOUS | Status: AC
  Filled 2016-05-13: qty 5

## 2016-05-13 MED ORDER — HEPARIN SOD (PORK) LOCK FLUSH 100 UNIT/ML IV SOLN
500.0000 [IU] | Freq: Once | INTRAVENOUS | Status: AC | PRN
  Administered 2016-05-13: 500 [IU]
  Filled 2016-05-13: qty 5

## 2016-05-13 MED ORDER — PALONOSETRON HCL INJECTION 0.25 MG/5ML
0.2500 mg | Freq: Once | INTRAVENOUS | Status: AC
  Administered 2016-05-13: 0.25 mg via INTRAVENOUS

## 2016-05-13 MED ORDER — SODIUM CHLORIDE 0.9 % IV SOLN
10.0000 mg | Freq: Once | INTRAVENOUS | Status: AC
  Administered 2016-05-13: 10 mg via INTRAVENOUS
  Filled 2016-05-13: qty 1

## 2016-05-13 NOTE — Patient Instructions (Signed)
Cyclophosphamide injection What is this medicine? CYCLOPHOSPHAMIDE (sye kloe FOSS fa mide) is a chemotherapy drug. It slows the growth of cancer cells. This medicine is used to treat many types of cancer like lymphoma, myeloma, leukemia, breast cancer, and ovarian cancer, to name a few. This medicine may be used for other purposes; ask your health care provider or pharmacist if you have questions. What should I tell my health care provider before I take this medicine? They need to know if you have any of these conditions: -blood disorders -history of other chemotherapy -infection -kidney disease -liver disease -recent or ongoing radiation therapy -tumors in the bone marrow -an unusual or allergic reaction to cyclophosphamide, other chemotherapy, other medicines, foods, dyes, or preservatives -pregnant or trying to get pregnant -breast-feeding How should I use this medicine? This drug is usually given as an injection into a vein or muscle or by infusion into a vein. It is administered in a hospital or clinic by a specially trained health care professional. Talk to your pediatrician regarding the use of this medicine in children. Special care may be needed. Overdosage: If you think you have taken too much of this medicine contact a poison control center or emergency room at once. NOTE: This medicine is only for you. Do not share this medicine with others. What if I miss a dose? It is important not to miss your dose. Call your doctor or health care professional if you are unable to keep an appointment. What may interact with this medicine? This medicine may interact with the following medications: -amiodarone -amphotericin B -azathioprine -certain antiviral medicines for HIV or AIDS such as protease inhibitors (e.g., indinavir, ritonavir) and zidovudine -certain blood pressure medications such as benazepril, captopril, enalapril, fosinopril, lisinopril, moexipril, monopril, perindopril,  quinapril, ramipril, trandolapril -certain cancer medications such as anthracyclines (e.g., daunorubicin, doxorubicin), busulfan, cytarabine, paclitaxel, pentostatin, tamoxifen, trastuzumab -certain diuretics such as chlorothiazide, chlorthalidone, hydrochlorothiazide, indapamide, metolazone -certain medicines that treat or prevent blood clots like warfarin -certain muscle relaxants such as succinylcholine -cyclosporine -etanercept -indomethacin -medicines to increase blood counts like filgrastim, pegfilgrastim, sargramostim -medicines used as general anesthesia -metronidazole -natalizumab This list may not describe all possible interactions. Give your health care provider a list of all the medicines, herbs, non-prescription drugs, or dietary supplements you use. Also tell them if you smoke, drink alcohol, or use illegal drugs. Some items may interact with your medicine. What should I watch for while using this medicine? Visit your doctor for checks on your progress. This drug may make you feel generally unwell. This is not uncommon, as chemotherapy can affect healthy cells as well as cancer cells. Report any side effects. Continue your course of treatment even though you feel ill unless your doctor tells you to stop. Drink water or other fluids as directed. Urinate often, even at night. In some cases, you may be given additional medicines to help with side effects. Follow all directions for their use. Call your doctor or health care professional for advice if you get a fever, chills or sore throat, or other symptoms of a cold or flu. Do not treat yourself. This drug decreases your body's ability to fight infections. Try to avoid being around people who are sick. This medicine may increase your risk to bruise or bleed. Call your doctor or health care professional if you notice any unusual bleeding. Be careful brushing and flossing your teeth or using a toothpick because you may get an infection or  bleed more easily. If you have  any dental work done, tell your dentist you are receiving this medicine. You may get drowsy or dizzy. Do not drive, use machinery, or do anything that needs mental alertness until you know how this medicine affects you. Do not become pregnant while taking this medicine or for 1 year after stopping it. Women should inform their doctor if they wish to become pregnant or think they might be pregnant. Men should not father a child while taking this medicine and for 4 months after stopping it. There is a potential for serious side effects to an unborn child. Talk to your health care professional or pharmacist for more information. Do not breast-feed an infant while taking this medicine. This medicine may interfere with the ability to have a child. This medicine has caused ovarian failure in some women. This medicine has caused reduced sperm counts in some men. You should talk with your doctor or health care professional if you are concerned about your fertility. If you are going to have surgery, tell your doctor or health care professional that you have taken this medicine. What side effects may I notice from receiving this medicine? Side effects that you should report to your doctor or health care professional as soon as possible: -allergic reactions like skin rash, itching or hives, swelling of the face, lips, or tongue -low blood counts - this medicine may decrease the number of white blood cells, red blood cells and platelets. You may be at increased risk for infections and bleeding. -signs of infection - fever or chills, cough, sore throat, pain or difficulty passing urine -signs of decreased platelets or bleeding - bruising, pinpoint red spots on the skin, black, tarry stools, blood in the urine -signs of decreased red blood cells - unusually weak or tired, fainting spells, lightheadedness -breathing problems -dark urine -dizziness -palpitations -swelling of the  ankles, feet, hands -trouble passing urine or change in the amount of urine -weight gain -yellowing of the eyes or skin Side effects that usually do not require medical attention (report to your doctor or health care professional if they continue or are bothersome): -changes in nail or skin color -hair loss -missed menstrual periods -mouth sores -nausea, vomiting This list may not describe all possible side effects. Call your doctor for medical advice about side effects. You may report side effects to FDA at 1-800-FDA-1088. Where should I keep my medicine? This drug is given in a hospital or clinic and will not be stored at home. NOTE: This sheet is a summary. It may not cover all possible information. If you have questions about this medicine, talk to your doctor, pharmacist, or health care provider.    2016, [employer]. (2012-06-03 16:22:58) Docetaxel injection What is this medicine? DOCETAXEL (doe se TAX el) is a chemotherapy drug. It targets fast dividing cells, like cancer cells, and causes these cells to die. This medicine is used to treat many types of cancers like breast cancer, certain stomach cancers, head and neck cancer, lung cancer, and prostate cancer. This medicine may be used for other purposes; ask your health care provider or pharmacist if you have questions. What should I tell my health care provider before I take this medicine? They need to know if you have any of these conditions: -infection (especially a virus infection such as chickenpox, cold sores, or herpes) -liver disease -low blood counts, like low white cell, platelet, or red cell counts -an unusual or allergic reaction to docetaxel, polysorbate 80, other chemotherapy agents, other medicines, foods,  dyes, or preservatives -pregnant or trying to get pregnant -breast-feeding How should I use this medicine? This drug is given as an infusion into a vein. It is administered in a hospital or clinic by  a specially trained health care professional. Talk to your pediatrician regarding the use of this medicine in children. Special care may be needed. Overdosage: If you think you have taken too much of this medicine contact a poison control center or emergency room at once. NOTE: This medicine is only for you. Do not share this medicine with others. What if I miss a dose? It is important not to miss your dose. Call your doctor or health care professional if you are unable to keep an appointment. What may interact with this medicine? -cyclosporine -erythromycin -ketoconazole -medicines to increase blood counts like filgrastim, pegfilgrastim, sargramostim -vaccines Talk to your doctor or health care professional before taking any of these medicines: -acetaminophen -aspirin -ibuprofen -ketoprofen -naproxen This list may not describe all possible interactions. Give your health care provider a list of all the medicines, herbs, non-prescription drugs, or dietary supplements you use. Also tell them if you smoke, drink alcohol, or use illegal drugs. Some items may interact with your medicine. What should I watch for while using this medicine? Your condition will be monitored carefully while you are receiving this medicine. You will need important blood work done while you are taking this medicine. This drug may make you feel generally unwell. This is not uncommon, as chemotherapy can affect healthy cells as well as cancer cells. Report any side effects. Continue your course of treatment even though you feel ill unless your doctor tells you to stop. In some cases, you may be given additional medicines to help with side effects. Follow all directions for their use. Call your doctor or health care professional for advice if you get a fever, chills or sore throat, or other symptoms of a cold or flu. Do not treat yourself. This drug decreases your body's ability to fight infections. Try to avoid being around  people who are sick. This medicine may increase your risk to bruise or bleed. Call your doctor or health care professional if you notice any unusual bleeding. This medicine may contain alcohol in the product. You may get drowsy or dizzy. Do not drive, use machinery, or do anything that needs mental alertness until you know how this medicine affects you. Do not stand or sit up quickly, especially if you are an older patient. This reduces the risk of dizzy or fainting spells. Avoid alcoholic drinks. Do not become pregnant while taking this medicine. Women should inform their doctor if they wish to become pregnant or think they might be pregnant. There is a potential for serious side effects to an unborn child. Talk to your health care professional or pharmacist for more information. Do not breast-feed an infant while taking this medicine. What side effects may I notice from receiving this medicine? Side effects that you should report to your doctor or health care professional as soon as possible: -allergic reactions like skin rash, itching or hives, swelling of the face, lips, or tongue -low blood counts - This drug may decrease the number of white blood cells, red blood cells and platelets. You may be at increased risk for infections and bleeding. -signs of infection - fever or chills, cough, sore throat, pain or difficulty passing urine -signs of decreased platelets or bleeding - bruising, pinpoint red spots on the skin, black, tarry stools, nosebleeds -signs  of decreased red blood cells - unusually weak or tired, fainting spells, lightheadedness -breathing problems -fast or irregular heartbeat -low blood pressure -mouth sores -nausea and vomiting -pain, swelling, redness or irritation at the injection site -pain, tingling, numbness in the hands or feet -swelling of the ankle, feet, hands -weight gain Side effects that usually do not require medical attention (report to your prescriber or health  care professional if they continue or are bothersome): -bone pain -complete hair loss including hair on your head, underarms, pubic hair, eyebrows, and eyelashes -diarrhea -excessive tearing -changes in the color of fingernails -loosening of the fingernails -nausea -muscle pain -red flush to skin -sweating -weak or tired This list may not describe all possible side effects. Call your doctor for medical advice about side effects. You may report side effects to FDA at 1-800-FDA-1088. Where should I keep my medicine? This drug is given in a hospital or clinic and will not be stored at home. NOTE: This sheet is a summary. It may not cover all possible information. If you have questions about this medicine, talk to your doctor, pharmacist, or health care provider.    2016, [employer] Standard. (2014-08-06 16:04:57) Pegfilgrastim injection What is this medicine? PEGFILGRASTIM (PEG fil gra stim) is a long-acting granulocyte colony-stimulating factor that stimulates the growth of neutrophils, a type of white blood cell important in the body's fight against infection. It is used to reduce the incidence of fever and infection in patients with certain types of cancer who are receiving chemotherapy that affects the bone marrow, and to increase survival after being exposed to high doses of radiation. This medicine may be used for other purposes; ask your health care provider or pharmacist if you have questions. What should I tell my health care provider before I take this medicine? They need to know if you have any of these conditions: -kidney disease -latex allergy -ongoing radiation therapy -sickle cell disease -skin reactions to acrylic adhesives (On-Body Injector only) -an unusual or allergic reaction to pegfilgrastim, filgrastim, other medicines, foods, dyes, or preservatives -pregnant or trying to get pregnant -breast-feeding How should I use this medicine? This medicine is for  injection under the skin. If you get this medicine at home, you will be taught how to prepare and give the pre-filled syringe or how to use the On-body Injector. Refer to the patient Instructions for Use for detailed instructions. Use exactly as directed. Take your medicine at regular intervals. Do not take your medicine more often than directed. It is important that you put your used needles and syringes in a special sharps container. Do not put them in a trash can. If you do not have a sharps container, call your pharmacist or healthcare provider to get one. Talk to your pediatrician regarding the use of this medicine in children. While this drug may be prescribed for selected conditions, precautions do apply. Overdosage: If you think you have taken too much of this medicine contact a poison control center or emergency room at once. NOTE: This medicine is only for you. Do not share this medicine with others. What if I miss a dose? It is important not to miss your dose. Call your doctor or health care professional if you miss your dose. If you miss a dose due to an On-body Injector failure or leakage, a new dose should be administered as soon as possible using a single prefilled syringe for manual use. What may interact with this medicine? Interactions have not been studied. Give your  health care provider a list of all the medicines, herbs, non-prescription drugs, or dietary supplements you use. Also tell them if you smoke, drink alcohol, or use illegal drugs. Some items may interact with your medicine. This list may not describe all possible interactions. Give your health care provider a list of all the medicines, herbs, non-prescription drugs, or dietary supplements you use. Also tell them if you smoke, drink alcohol, or use illegal drugs. Some items may interact with your medicine. What should I watch for while using this medicine? You may need blood work done while you are taking this medicine. If  you are going to need a MRI, CT scan, or other procedure, tell your doctor that you are using this medicine (On-Body Injector only). What side effects may I notice from receiving this medicine? Side effects that you should report to your doctor or health care professional as soon as possible: -allergic reactions like skin rash, itching or hives, swelling of the face, lips, or tongue -dizziness -fever -pain, redness, or irritation at site where injected -pinpoint red spots on the skin -red or dark-brown urine -shortness of breath or breathing problems -stomach or side pain, or pain at the shoulder -swelling -tiredness -trouble passing urine or change in the amount of urine Side effects that usually do not require medical attention (report to your doctor or health care professional if they continue or are bothersome): -bone pain -muscle pain This list may not describe all possible side effects. Call your doctor for medical advice about side effects. You may report side effects to FDA at 1-800-FDA-1088. Where should I keep my medicine? Keep out of the reach of children. Store pre-filled syringes in a refrigerator between 2 and 8 degrees C (36 and 46 degrees F). Do not freeze. Keep in carton to protect from light. Throw away this medicine if it is left out of the refrigerator for more than 48 hours. Throw away any unused medicine after the expiration date. NOTE: This sheet is a summary. It may not cover all possible information. If you have questions about this medicine, talk to your doctor, pharmacist, or health care provider.    2016, [employer] Standard. (2014-08-09 14:30:14)

## 2016-05-14 ENCOUNTER — Telehealth: Payer: Self-pay | Admitting: Genetic Counselor

## 2016-05-14 ENCOUNTER — Encounter: Payer: Self-pay | Admitting: Nurse Practitioner

## 2016-05-14 NOTE — Progress Notes (Signed)
24 Hour Chemotherapy Follow up Call  [name] called at home following administration of Cytoxan and Taxotere chemotherapy. Patient reports no symptoms at this time.   Medications reviewed decadron, ativan, zofran, and compazine.   Following interventions recommended encouraged patient to continue to drink plenty of fluids and take zofran as ordered despite not having any feelings of nausea or vomiting episodes.  Reviewed all post chemotherapy instructions with patient and when should call clinic or MD.  Patient verbalized understanding and stated she was feeling great today and slept well last night.

## 2016-05-14 NOTE — Telephone Encounter (Signed)
Patient called to cancel appt. Will not be rescheduling, per patient. 05/14/16

## 2016-05-15 ENCOUNTER — Encounter: Payer: Self-pay | Admitting: Hematology & Oncology

## 2016-05-15 ENCOUNTER — Ambulatory Visit: Payer: BLUE CROSS/BLUE SHIELD

## 2016-05-19 ENCOUNTER — Telehealth: Payer: Self-pay | Admitting: Emergency Medicine

## 2016-05-19 NOTE — Telephone Encounter (Signed)
Patient called complaining of lower back pain onset this am. Patient states taking Ibuprofen as needed for pain which is giving her some pain relief; Patient states the pain at its worst is a 6/10. Patient denies fever; states she is drinking plenty of fluids and states eating well. Advised patient that she may take her Oxycodone 5mg  as directed for the lower back pain. Advised her to go to the ER for any worsening symptoms. Advised her to call this office back for any further questions or concerns. Patient verbalized understanding.

## 2016-05-26 ENCOUNTER — Encounter: Payer: BLUE CROSS/BLUE SHIELD | Admitting: Genetic Counselor

## 2016-05-26 ENCOUNTER — Other Ambulatory Visit: Payer: BLUE CROSS/BLUE SHIELD

## 2016-06-03 ENCOUNTER — Ambulatory Visit (HOSPITAL_BASED_OUTPATIENT_CLINIC_OR_DEPARTMENT_OTHER): Payer: BLUE CROSS/BLUE SHIELD | Admitting: Hematology & Oncology

## 2016-06-03 ENCOUNTER — Other Ambulatory Visit (HOSPITAL_BASED_OUTPATIENT_CLINIC_OR_DEPARTMENT_OTHER): Payer: BLUE CROSS/BLUE SHIELD

## 2016-06-03 ENCOUNTER — Ambulatory Visit (HOSPITAL_BASED_OUTPATIENT_CLINIC_OR_DEPARTMENT_OTHER): Payer: BLUE CROSS/BLUE SHIELD

## 2016-06-03 VITALS — BP 108/66 | HR 78 | Temp 98.0°F | Resp 16

## 2016-06-03 DIAGNOSIS — C50912 Malignant neoplasm of unspecified site of left female breast: Secondary | ICD-10-CM

## 2016-06-03 DIAGNOSIS — C50312 Malignant neoplasm of lower-inner quadrant of left female breast: Secondary | ICD-10-CM

## 2016-06-03 DIAGNOSIS — Z5111 Encounter for antineoplastic chemotherapy: Secondary | ICD-10-CM | POA: Diagnosis not present

## 2016-06-03 DIAGNOSIS — R21 Rash and other nonspecific skin eruption: Secondary | ICD-10-CM

## 2016-06-03 LAB — CBC WITH DIFFERENTIAL (CANCER CENTER ONLY)
BASO#: 0 10*3/uL (ref 0.0–0.2)
BASO%: 0.2 % (ref 0.0–2.0)
EOS ABS: 0 10*3/uL (ref 0.0–0.5)
EOS%: 0.1 % (ref 0.0–7.0)
HCT: 36.5 % (ref 34.8–46.6)
HGB: 12.4 g/dL (ref 11.6–15.9)
LYMPH#: 0.7 10*3/uL — ABNORMAL LOW (ref 0.9–3.3)
LYMPH%: 4.3 % — AB (ref 14.0–48.0)
MCH: 33.9 pg (ref 26.0–34.0)
MCHC: 34 g/dL (ref 32.0–36.0)
MCV: 100 fL (ref 81–101)
MONO#: 0.3 10*3/uL (ref 0.1–0.9)
MONO%: 1.6 % (ref 0.0–13.0)
NEUT#: 16 10*3/uL — ABNORMAL HIGH (ref 1.5–6.5)
NEUT%: 93.8 % — AB (ref 39.6–80.0)
PLATELETS: 378 10*3/uL (ref 145–400)
RBC: 3.66 10*6/uL — ABNORMAL LOW (ref 3.70–5.32)
RDW: 14.6 % (ref 11.1–15.7)
WBC: 17.1 10*3/uL — ABNORMAL HIGH (ref 3.9–10.0)

## 2016-06-03 LAB — CMP (CANCER CENTER ONLY)
ALT(SGPT): 21 U/L (ref 10–47)
AST: 19 U/L (ref 11–38)
Albumin: 3.9 g/dL (ref 3.3–5.5)
Alkaline Phosphatase: 58 U/L (ref 26–84)
BUN: 5 mg/dL — AB (ref 7–22)
CHLORIDE: 105 meq/L (ref 98–108)
CO2: 26 mEq/L (ref 18–33)
Calcium: 9.8 mg/dL (ref 8.0–10.3)
Creat: 1 mg/dl (ref 0.6–1.2)
GLUCOSE: 129 mg/dL — AB (ref 73–118)
Potassium: 3.9 mEq/L (ref 3.3–4.7)
Sodium: 138 mEq/L (ref 128–145)
Total Bilirubin: 0.6 mg/dl (ref 0.20–1.60)
Total Protein: 6.8 g/dL (ref 6.4–8.1)

## 2016-06-03 MED ORDER — DOCETAXEL CHEMO INJECTION 160 MG/16ML
75.0000 mg/m2 | Freq: Once | INTRAVENOUS | Status: AC
  Administered 2016-06-03: 130 mg via INTRAVENOUS
  Filled 2016-06-03: qty 13

## 2016-06-03 MED ORDER — SODIUM CHLORIDE 0.9 % IV SOLN
Freq: Once | INTRAVENOUS | Status: AC
  Administered 2016-06-03: 15:00:00 via INTRAVENOUS

## 2016-06-03 MED ORDER — HEPARIN SOD (PORK) LOCK FLUSH 100 UNIT/ML IV SOLN
500.0000 [IU] | Freq: Once | INTRAVENOUS | Status: AC | PRN
  Administered 2016-06-03: 500 [IU]
  Filled 2016-06-03: qty 5

## 2016-06-03 MED ORDER — DOXYCYCLINE HYCLATE 100 MG PO TABS: 100.0000 mg | ORAL_TABLET | Freq: Two times a day (BID) | ORAL | 4 refills | Status: DC

## 2016-06-03 MED ORDER — SODIUM CHLORIDE 0.9% FLUSH
10.0000 mL | INTRAVENOUS | Status: DC | PRN
  Administered 2016-06-03: 10 mL
  Filled 2016-06-03: qty 10

## 2016-06-03 MED ORDER — MOMETASONE FUROATE 0.1 % EX OINT: TOPICAL_OINTMENT | Freq: Every day | CUTANEOUS | 2 refills | Status: DC

## 2016-06-03 MED ORDER — PALONOSETRON HCL INJECTION 0.25 MG/5ML
0.2500 mg | Freq: Once | INTRAVENOUS | Status: AC
  Administered 2016-06-03: 0.25 mg via INTRAVENOUS

## 2016-06-03 MED ORDER — DEXAMETHASONE SODIUM PHOSPHATE 10 MG/ML IJ SOLN
INTRAMUSCULAR | Status: AC
  Filled 2016-06-03: qty 1

## 2016-06-03 MED ORDER — PALONOSETRON HCL INJECTION 0.25 MG/5ML
INTRAVENOUS | Status: AC
  Filled 2016-06-03: qty 5

## 2016-06-03 MED ORDER — SODIUM CHLORIDE 0.9 % IV SOLN
600.0000 mg/m2 | Freq: Once | INTRAVENOUS | Status: AC
  Administered 2016-06-03: 1060 mg via INTRAVENOUS
  Filled 2016-06-03: qty 53

## 2016-06-03 MED ORDER — DEXAMETHASONE SODIUM PHOSPHATE 10 MG/ML IJ SOLN
10.0000 mg | Freq: Once | INTRAMUSCULAR | Status: AC
  Administered 2016-06-03: 10 mg via INTRAVENOUS

## 2016-06-03 NOTE — Patient Instructions (Signed)
[facility] Discharge Instructions for Patients Receiving Chemotherapy  Today you received the following chemotherapy agents Taxotere and Cytoxan.  To help prevent nausea and vomiting after your treatment, we encourage you to take your nausea medication.   If you develop nausea and vomiting that is not controlled by your nausea medication, call the clinic.   BELOW ARE SYMPTOMS THAT SHOULD BE REPORTED IMMEDIATELY:  *FEVER GREATER THAN 100.5 F  *CHILLS WITH OR WITHOUT FEVER  NAUSEA AND VOMITING THAT IS NOT CONTROLLED WITH YOUR NAUSEA MEDICATION  *UNUSUAL SHORTNESS OF BREATH  *UNUSUAL BRUISING OR BLEEDING  TENDERNESS IN MOUTH AND THROAT WITH OR WITHOUT PRESENCE OF ULCERS  *URINARY PROBLEMS  *BOWEL PROBLEMS  UNUSUAL RASH Items with * indicate a potential emergency and should be followed up as soon as possible.  Feel free to call the clinic you have any questions or concerns. The clinic phone number is [phone].  Please show the CHEMO ALERT CARD at check-in to the Emergency Department and triage nurse.   

## 2016-06-03 NOTE — Progress Notes (Signed)
Hematology and Oncology Follow Up Visit  Laura Landry [id number] 06-01-1956 60 y.o. 06/03/2016   Principle Diagnosis:   Stage IIB (T2N1M0) invasive lobular carcinoma of the LEFT breast- ER+/HER2-;  Oncotype score is 26  Current Therapy:    Taxotere/Cytoxan - s/p cycle #1     Interim History:  Laura Landry is back for her second office visit. We first saw her back in late September. We started her on chemotherapy with Taxotere/Cytoxan. She tolerated this pretty well. She has lost her hair. Patient did have some mucositis. Some Magic mouthwash seemed to help with this.  She did not like the Neulasta. As such, she will not take the last with the next 3 cycles. I will go ahead and give her some doxycycline to take empirically. She has a lot of antibiotic allergies.  She has had no problems with diarrhea. She's had no rashes. She's had no leg swelling. She's had no cough or shortness of breath.  Overall, her performance status is ECOG 1.  Medications:  Current Outpatient Prescriptions:  .  albuterol (PROVENTIL HFA;VENTOLIN HFA) 108 (90 Base) MCG/ACT inhaler, Inhale 1-2 puffs into the lungs every 6 (six) hours as needed for wheezing or shortness of breath., Disp: 1 Inhaler, Rfl: 0 .  Calcium Carbonate-Vitamin D (CALCIUM-VITAMIN D) 500-200 MG-UNIT tablet, Take 2 tablets by mouth daily. , Disp: , Rfl:  .  dexamethasone (DECADRON) 4 MG tablet, Take 2 tablets twice a day with food for 4 days.  Start day BEFORE each chemotherapy cycle., Disp: 80 tablet, Rfl: 0 .  hydrocortisone cream 1 %, Apply 1 application topically 2 (two) times daily as needed (nash)., Disp: , Rfl:  .  ibuprofen (ADVIL,MOTRIN) 200 MG tablet, Take 200 mg by mouth every 6 (six) hours as needed for mild pain (for pain.). , Disp: , Rfl:  .  levothyroxine (SYNTHROID, LEVOTHROID) 100 MCG tablet, Take 1 tablet (100 mcg total) by mouth daily., Disp: 90 tablet, Rfl: 1 .  lidocaine-prilocaine (EMLA) cream, Apply to affected area once,  Disp: 30 g, Rfl: 3 .  LORazepam (ATIVAN) 0.5 MG tablet, Take 1 tablet (0.5 mg total) by mouth every 6 (six) hours as needed (Nausea or vomiting)., Disp: 30 tablet, Rfl: 0 .  Multiple Vitamins-Minerals (ALIVE WOMENS 50+ PO), Take 1 tablet by mouth daily. , Disp: , Rfl:  .  naphazoline-pheniramine (NAPHCON-A) 0.025-0.3 % ophthalmic solution, Place 1 drop into both eyes 4 (four) times daily as needed for irritation or allergies., Disp: , Rfl:  .  ondansetron (ZOFRAN) 8 MG tablet, Take 1 tablet (8 mg total) by mouth 2 (two) times daily as needed for refractory nausea / vomiting. Start on day 3 after chemo., Disp: 30 tablet, Rfl: 1 .  prochlorperazine (COMPAZINE) 10 MG tablet, Take 1 tablet (10 mg total) by mouth every 6 (six) hours as needed (Nausea or vomiting)., Disp: 30 tablet, Rfl: 1 .  SALINE NASAL MIST NA, Place 1 spray into the nose daily as needed (nasal congestion)., Disp: , Rfl:  .  doxycycline (VIBRA-TABS) 100 MG tablet, Take 1 tablet (100 mg total) by mouth 2 (two) times daily., Disp: 28 tablet, Rfl: 4 .  mometasone (ELOCON) 0.1 % ointment, Apply topically daily., Disp: 45 g, Rfl: 2 No current facility-administered medications for this visit.   Facility-Administered Medications Ordered in Other Visits:  .  heparin lock flush 100 unit/mL, 500 Units, Intracatheter, Once PRN, [name], MD .  sodium chloride flush (NS) 0.9 % injection 10 mL, 10 mL, Intracatheter,  PRN, [name], MD  Allergies:  Allergies  Allergen Reactions  . Clindamycin/Lincomycin Swelling    THROAT SWELLS  . Eggs Or Egg-Derived Products Swelling and Other (See Comments)    ABDOMINAL PAIN SWELLING, SWELLING REACTION UNSPECIFIED    . Ciprofloxacin Rash  . Codeine Hives and Swelling    SWELLING REACTION UNSPECIFIED   . Nickel Other (See Comments)    BLISTERS   . Penicillins Hives, Nausea And Vomiting, Swelling and Rash    SWELLING REACTION UNSPECIFIED  Has patient had PCN reaction causing immediate  rash, facial/tongue/throat swelling, SOB or lightheadedness with hypotension: YES PCN reaction causing severe rash involving mucus membranes or skin necrosis: NO PCN reaction that required hospitalization NO PCN reaction occurring within the last 10 years: NO If all of the above answers are "NO", then may proceed with Cephalosporin use.  . Sulfa Antibiotics Hives and Swelling    SWELLING REACTION UNSPECIFIED     Past Medical History, Surgical history, Social history, and Family History were reviewed and updated.  Review of Systems:  As above  Physical Exam:  vitals were not taken for this visit.  Wt Readings from Last 3 Encounters:  05/11/16 150 lb 1.6 oz (68.1 kg)  05/07/16 148 lb (67.1 kg)  05/01/16 148 lb (67.1 kg)      Well-developed well-nourished white female in no obvious distress. Head and neck exam shows no ocular or oral lesions. She has no palpable cervical or supraclavicular lymph nodes. Lungs are clear bilaterally. Cardiac exam regular rate and rhythm with no murmurs, rubs or bruits. Breast exam shows right breast with no masses, edema or erythema. There is no right axillary adenopathy. Left chest wall shows the nipple sparing mastectomy. She has an implant in place. She has no erythema about the implant. She has no palpable nodules. There is no palpable left axillary adenopathy. Abdomen soft. She has good bowel sounds. There is no fluid wave. There is no palpable liver or spleen tip. Active exam shows no tenderness over the spine, ribs or hips. External shows no clubbing, cyanosis or edema. There is no lymphedema of the left arm. Skin exam shows no rashes, ecchymoses or petechia. Neurological exam shows no focal neurological deficits.  Lab Results  Component Value Date   WBC 17.1 (H) 06/03/2016   HGB 12.4 06/03/2016   HCT 36.5 06/03/2016   MCV 100 06/03/2016   PLT 378 06/03/2016     Chemistry      Component Value Date/Time   NA 138 06/03/2016 1311   NA 141  03/18/2016 1223   K 3.9 06/03/2016 1311   K 3.8 03/18/2016 1223   CL 105 06/03/2016 1311   CO2 26 06/03/2016 1311   CO2 23 03/18/2016 1223   BUN 5 (L) 06/03/2016 1311   BUN 6.9 (L) 03/18/2016 1223   CREATININE 1.0 06/03/2016 1311   CREATININE 0.8 03/18/2016 1223   GLU 82 05/07/2015      Component Value Date/Time   CALCIUM 9.8 06/03/2016 1311   CALCIUM 10.9 (H) 03/18/2016 1223   ALKPHOS 58 06/03/2016 1311   ALKPHOS 62 03/18/2016 1223   AST 19 06/03/2016 1311   AST 20 03/18/2016 1223   ALT 21 06/03/2016 1311   ALT 15 03/18/2016 1223   BILITOT 0.60 06/03/2016 1311   BILITOT 0.60 03/18/2016 1223         Impression and Plan: Ms. Beed is A 60 year old postmenopausal white female with a stage IIb invasive lobular carcinoma the left breast. Her  tumor is ER positive. She does have the high Oncotype score which is why we are using chemotherapy.  We will go ahead with cycle #2 of Taxotere/Cytoxan. Again she will not get Neulasta. She understands quite well the high-risk of infection that she has by not using Neulasta. Hopefully, the doxycycline will help. Again she has quite a few anabolic allergies so we are limited as to what we can use.  I do not see that she is going need any radiation therapy. She had 1 lymph node that was positive. She had a tumor that was only 2.5 cm in size. I think that was again done with chemotherapy, that we can yet her on Femara and that should be adequate.  I may also talked to her about Prolia. This has been shown to decrease the risk of recurrence.  I will plan to get her back to see [place] in another 3 weeks.  I spent about 35 minutes with she and her husband.   [name], MD 11/1/20175:29 PM

## 2016-06-03 NOTE — Progress Notes (Signed)
Referral MD  Reason for Referral: Stage IIB (T2N1M0) invasive lobular carcinoma of the LEFT breast - ER+/HER2-.  Oncotype score 26  Chief Complaint  Patient presents with  . Follow-up  : I have breast cancer in the left breast  HPI: [name] is a very charming 60yo post menopausal white femaleWho has been quite healthy. She does have hypo-thyroidism.  Her mother had breast cancer in her 70s. She had a screening mammogram back in July. This found a asymmetry in the left breast. This was in the upper inner quadrant. Ultrasound confirmed a 2 cm irregular mass.  She then underwent a biopsy on August 2. The pathology report ([id number]) showed an invasive lobular breast cancer area and it was ER positive and HER-2 negative.  She then had an MRI. MRI showed a 2.6 x 1.8 x 2.77 m mass at the 6-7:00 position. Lymph nodes appear to be normal in size.  She then underwent a mastectomy. She had immediate reconstruction with implant. The pathology report ([id number]) showed an invasive lobular carcinoma. This measured 2.5 cm. There was a focally positive margin. She had 1 lymph node that was positive. There is lymphovascular invasion.  She did have an Oncotype assay done. The score was high at 26.  She apparently was on tamoxifen for a couple weeks before surgery.  She does not want to take aromatase inhibitors. She feels that this will cause her to have cancer elsewhere. I'm not aware of these agents causing cancer. However, she gets on the Internet and finds these reports.  She has 2 children. Her first child was born when she was 30 years old. She went into menopause age 32. She's not been on any postmenopausal estrogen.  She does not smoke. She really does not drink.    she has a very good performance status. I'll set her performance status is ECOG 0.    she has been seen by radiation oncology already. I did this on her before she had her mastectomy. She currently is being seen by plastic  surgery. She is having left breast reconstruction with tissue expander.  Her appetite has been good. In reality, she really looks quite good. She certainly does not look her age.     Past Medical History:  Diagnosis Date  . Anemia    history of as child  . Anxiety   . Asthma    seasonal  . Breast cancer of lower-inner quadrant of left female breast ([place]) 03/09/2016  . Colitis   . Fatigue    very hard to wake up when fatigued  . Glaucoma    bilateral  . Hypothyroidism   . PONV (postoperative nausea and vomiting)   :   Past Surgical History:  Procedure Laterality Date  . BREAST RECONSTRUCTION WITH PLACEMENT OF TISSUE EXPANDER AND FLEX HD (ACELLULAR HYDRATED DERMIS) Left 04/14/2016   Procedure: LEFT BREAST RECONSTRUCTION WITH PLACEMENT OF TISSUE EXPANDER;  Surgeon: Irene Limbo, MD;  Location: [facility];  Service: Plastics;  Laterality: Left;  . BREAST SURGERY Left 04/14/2016   mastectomy  . COLONOSCOPY W/ POLYPECTOMY    . EYE SURGERY Left    laser surgery due to high  pressure  . NIPPLE SPARING MASTECTOMY/SENTINAL LYMPH NODE BIOPSY/RECONSTRUCTION/PLACEMENT OF TISSUE EXPANDER Left 04/14/2016   Procedure: LEFT NIPPLE SPARING MASTECTOMY WITH LEFT AXILLARY SENTINEL LYMPH NODE BIOPSY;  Surgeon: Rolm Bookbinder, MD;  Location: [facility];  Service: General;  Laterality: Left;  . PORTACATH PLACEMENT N/A 05/07/2016   Procedure: INSERTION PORT-A-CATH WITH  [place];  Surgeon: Rolm Bookbinder, MD;  Location: [facility];  Service: General;  Laterality: N/A;  . WISDOM TOOTH EXTRACTION    :   Current Outpatient Prescriptions:  .  albuterol (PROVENTIL HFA;VENTOLIN HFA) 108 (90 Base) MCG/ACT inhaler, Inhale 1-2 puffs into the lungs every 6 (six) hours as needed for wheezing or shortness of breath., Disp: 1 Inhaler, Rfl: 0 .  Calcium Carbonate-Vitamin D (CALCIUM-VITAMIN D) 500-200 MG-UNIT tablet, Take 2 tablets by mouth daily. , Disp: , Rfl:  .  dexamethasone (DECADRON) 4 MG tablet, Take 2 tablets twice a  day with food for 4 days.  Start day BEFORE each chemotherapy cycle., Disp: 80 tablet, Rfl: 0 .  hydrocortisone cream 1 %, Apply 1 application topically 2 (two) times daily as needed (nash)., Disp: , Rfl:  .  ibuprofen (ADVIL,MOTRIN) 200 MG tablet, Take 200 mg by mouth every 6 (six) hours as needed for mild pain (for pain.). , Disp: , Rfl:  .  levothyroxine (SYNTHROID, LEVOTHROID) 100 MCG tablet, Take 1 tablet (100 mcg total) by mouth daily., Disp: 90 tablet, Rfl: 1 .  lidocaine-prilocaine (EMLA) cream, Apply to affected area once, Disp: 30 g, Rfl: 3 .  LORazepam (ATIVAN) 0.5 MG tablet, Take 1 tablet (0.5 mg total) by mouth every 6 (six) hours as needed (Nausea or vomiting)., Disp: 30 tablet, Rfl: 0 .  Multiple Vitamins-Minerals (ALIVE WOMENS 50+ PO), Take 1 tablet by mouth daily. , Disp: , Rfl:  .  naphazoline-pheniramine (NAPHCON-A) 0.025-0.3 % ophthalmic solution, Place 1 drop into both eyes 4 (four) times daily as needed for irritation or allergies., Disp: , Rfl:  .  ondansetron (ZOFRAN) 8 MG tablet, Take 1 tablet (8 mg total) by mouth 2 (two) times daily as needed for refractory nausea / vomiting. Start on day 3 after chemo., Disp: 30 tablet, Rfl: 1 .  prochlorperazine (COMPAZINE) 10 MG tablet, Take 1 tablet (10 mg total) by mouth every 6 (six) hours as needed (Nausea or vomiting)., Disp: 30 tablet, Rfl: 1 .  SALINE NASAL MIST NA, Place 1 spray into the nose daily as needed (nasal congestion)., Disp: , Rfl: :  :   Allergies  Allergen Reactions  . Clindamycin/Lincomycin Swelling    THROAT SWELLS  . Eggs Or Egg-Derived Products Swelling and Other (See Comments)    ABDOMINAL PAIN SWELLING, SWELLING REACTION UNSPECIFIED    . Ciprofloxacin Rash  . Codeine Hives and Swelling    SWELLING REACTION UNSPECIFIED   . Nickel Other (See Comments)    BLISTERS   . Penicillins Hives, Nausea And Vomiting, Swelling and Rash    SWELLING REACTION UNSPECIFIED  Has patient had PCN reaction causing  immediate rash, facial/tongue/throat swelling, SOB or lightheadedness with hypotension: YES PCN reaction causing severe rash involving mucus membranes or skin necrosis: NO PCN reaction that required hospitalization NO PCN reaction occurring within the last 10 years: NO If all of the above answers are "NO", then may proceed with Cephalosporin use.  . Sulfa Antibiotics Hives and Swelling    SWELLING REACTION UNSPECIFIED   :   Family History  Problem Relation Age of Onset  . Colon cancer Paternal Grandmother   . Cancer Paternal Grandmother     colon  . Cancer Mother     breast  . Hypertension Father   . Asthma Father   . Diabetes Brother   . Ovarian cancer Paternal Aunt   . Ovarian cancer Maternal Aunt   :   Social History   Social History  .  Marital status: Married    Spouse name: N/A  . Number of children: N/A  . Years of education: N/A   Occupational History  . Not on file.   Social History Main Topics  . Smoking status: Former Smoker    Quit date: 08/03/2012  . Smokeless tobacco: Never Used  . Alcohol use 2.4 oz/week    4 Glasses of wine per week  . Drug use: No  . Sexual activity: Yes    Birth control/ protection: Post-menopausal   Other Topics Concern  . Not on file   Social History Narrative  . No narrative on file  :  Pertinent items are noted in HPI.  Exam: '@IPVITALS' @  Well-developed and well-nourished white female in no obvious distress. Vital signs show a temperature of 97.7. Pulse 74. Blood pressure 145/87. Weight is 148 pounds. Head and neck exam shows no ocular or oral lesions. There are no palpable cervical or supraclavicular lymph nodes. Lungs are clear bilaterally. Cardiac exam regular rate and rhythm with no murmurs, rubs or bruits. Breast exam shows right breast no masses, edema or erythema. There is no right axillary adenopathy. Left chest wall shows the nipple sparing mastectomy. she has an implant. There is some ecchymoses. There is some  tenderness about the left ostectomy site. There is no left mediastinal adenopathy. Abdomen is soft. She has good bowel sounds. There is no fluid wave. There is no palpable liver or spleen tip. Back exam shows no tenderness over the spine, ribs or hips. Extremities shows no clubbing, cyanosis or edema. Skin exam shows no rashes, ecchymoses or petechia. Neurological exam shows no focal neurological deficits.   No results for input(s): WBC, HGB, HCT, PLT in the last 72 hours. No results for input(s): NA, K, CL, CO2, GLUCOSE, BUN, CREATININE, CALCIUM in the last 72 hours.  Blood smear review:  None   Pathology: As above     Assessment and Plan:  Ms. Laura Landry is a very charming 60 year old postmenopausal white female. She has stage IIb invasive lobular carcinoma of the left breast. She had a nipple sparing mastectomy followed by immediate reconstruction.  She had 1 lymph node positive.  I think that her Oncotype score is quite high. Her Oncotype score is 26. I think that she would benefit from adjuvant chemotherapy. I realize that the benefit might not be all that great but yet I think given her young age and the fact that she is in good shape, there would be a benefit.  I talked to her and her husband about this. I think that she could tolerate Taxotere/Cytoxan. I think that 4 cycles would be appropriate for her.  She will need to have a Port-A-Cath placed. I'll see Dr. Donne Hazel can do this for [place].  I went over the side effects of treatment. Unfortunately, she will lose her hair. She has very nice hair but I know that it will come back when she finishes treatment.  I talked her about the risk of infection because of neutropenia. She will need to have Neulasta. I told her about fatigue, nausea and vomiting, possibly diarrhea. I don't think she would have to be transfused.  I think overall, she should go tolerate this okay.  I will know she will need any radiation therapy. Looks like the margins  are very close. As such, radiation might be something that will be incorporated. I will let the radiation oncologist think about this.  I spent about an hour and 20 minutes with she  and her husband. They're both very nice. She is obviously very well educated. She has done a lot of research.  Unfortunately, I'm not sure that she will ever go for antiestrogen therapy after chemotherapy. This might be another reason why chemotherapy will be helpful. She has very little problem taking chemotherapy but has a lot of problem with tamoxifen or aromatase inhibitor therapy.  We will have to make sure she gets into the chemotherapy education class. She already had information sheets about the chemotherapy protocol.  Hopefully, we will be started on October 11.  I'll plan to see her back when she starts her second cycle of treatment.

## 2016-06-05 ENCOUNTER — Ambulatory Visit: Payer: BLUE CROSS/BLUE SHIELD

## 2016-06-12 ENCOUNTER — Other Ambulatory Visit: Payer: BLUE CROSS/BLUE SHIELD

## 2016-06-12 ENCOUNTER — Ambulatory Visit: Payer: BLUE CROSS/BLUE SHIELD

## 2016-06-12 ENCOUNTER — Ambulatory Visit: Payer: BLUE CROSS/BLUE SHIELD | Admitting: Hematology & Oncology

## 2016-06-23 ENCOUNTER — Other Ambulatory Visit: Payer: Self-pay | Admitting: Hematology & Oncology

## 2016-06-24 ENCOUNTER — Ambulatory Visit: Payer: BLUE CROSS/BLUE SHIELD

## 2016-06-24 ENCOUNTER — Ambulatory Visit: Payer: BLUE CROSS/BLUE SHIELD | Admitting: Hematology & Oncology

## 2016-06-24 ENCOUNTER — Ambulatory Visit (HOSPITAL_BASED_OUTPATIENT_CLINIC_OR_DEPARTMENT_OTHER): Payer: BLUE CROSS/BLUE SHIELD

## 2016-06-24 ENCOUNTER — Ambulatory Visit (HOSPITAL_BASED_OUTPATIENT_CLINIC_OR_DEPARTMENT_OTHER): Payer: BLUE CROSS/BLUE SHIELD | Admitting: Hematology & Oncology

## 2016-06-24 ENCOUNTER — Encounter: Payer: Self-pay | Admitting: Hematology & Oncology

## 2016-06-24 ENCOUNTER — Other Ambulatory Visit (HOSPITAL_BASED_OUTPATIENT_CLINIC_OR_DEPARTMENT_OTHER): Payer: BLUE CROSS/BLUE SHIELD

## 2016-06-24 ENCOUNTER — Other Ambulatory Visit: Payer: BLUE CROSS/BLUE SHIELD

## 2016-06-24 VITALS — BP 144/97 | HR 82 | Temp 98.0°F | Resp 16 | Ht 65.0 in | Wt 149.0 lb

## 2016-06-24 DIAGNOSIS — R21 Rash and other nonspecific skin eruption: Secondary | ICD-10-CM

## 2016-06-24 DIAGNOSIS — C50312 Malignant neoplasm of lower-inner quadrant of left female breast: Secondary | ICD-10-CM

## 2016-06-24 DIAGNOSIS — C50912 Malignant neoplasm of unspecified site of left female breast: Secondary | ICD-10-CM

## 2016-06-24 DIAGNOSIS — Z170 Estrogen receptor positive status [ER+]: Secondary | ICD-10-CM

## 2016-06-24 DIAGNOSIS — C779 Secondary and unspecified malignant neoplasm of lymph node, unspecified: Secondary | ICD-10-CM | POA: Diagnosis not present

## 2016-06-24 DIAGNOSIS — Z5111 Encounter for antineoplastic chemotherapy: Secondary | ICD-10-CM | POA: Diagnosis not present

## 2016-06-24 LAB — CMP (CANCER CENTER ONLY)
ALBUMIN: 4.1 g/dL (ref 3.3–5.5)
ALK PHOS: 63 U/L (ref 26–84)
ALT: 16 U/L (ref 10–47)
AST: 22 U/L (ref 11–38)
BILIRUBIN TOTAL: 0.4 mg/dL (ref 0.20–1.60)
BUN: 9 mg/dL (ref 7–22)
CO2: 26 mEq/L (ref 18–33)
CREATININE: 0.7 mg/dL (ref 0.6–1.2)
Calcium: 10.2 mg/dL (ref 8.0–10.3)
Chloride: 106 mEq/L (ref 98–108)
Glucose, Bld: 119 mg/dL — ABNORMAL HIGH (ref 73–118)
Potassium: 4.1 mEq/L (ref 3.3–4.7)
SODIUM: 142 meq/L (ref 128–145)
TOTAL PROTEIN: 6.8 g/dL (ref 6.4–8.1)

## 2016-06-24 LAB — CBC WITH DIFFERENTIAL (CANCER CENTER ONLY)
BASO#: 0 10*3/uL (ref 0.0–0.2)
BASO%: 0 % (ref 0.0–2.0)
EOS ABS: 0 10*3/uL (ref 0.0–0.5)
EOS%: 0 % (ref 0.0–7.0)
HCT: 38.3 % (ref 34.8–46.6)
HEMOGLOBIN: 12.8 g/dL (ref 11.6–15.9)
LYMPH#: 0.8 10*3/uL — ABNORMAL LOW (ref 0.9–3.3)
LYMPH%: 3.3 % — AB (ref 14.0–48.0)
MCH: 33.4 pg (ref 26.0–34.0)
MCHC: 33.4 g/dL (ref 32.0–36.0)
MCV: 100 fL (ref 81–101)
MONO#: 0.6 10*3/uL (ref 0.1–0.9)
MONO%: 2.8 % (ref 0.0–13.0)
NEUT%: 93.9 % — ABNORMAL HIGH (ref 39.6–80.0)
NEUTROS ABS: 21.3 10*3/uL — AB (ref 1.5–6.5)
PLATELETS: 325 10*3/uL (ref 145–400)
RBC: 3.83 10*6/uL (ref 3.70–5.32)
RDW: 14 % (ref 11.1–15.7)
WBC: 22.7 10*3/uL — AB (ref 3.9–10.0)

## 2016-06-24 MED ORDER — SODIUM CHLORIDE 0.9% FLUSH
10.0000 mL | INTRAVENOUS | Status: DC | PRN
  Administered 2016-06-24: 10 mL
  Filled 2016-06-24: qty 10

## 2016-06-24 MED ORDER — SODIUM CHLORIDE 0.9 % IV SOLN
Freq: Once | INTRAVENOUS | Status: AC
  Administered 2016-06-24: 12:00:00 via INTRAVENOUS

## 2016-06-24 MED ORDER — DOCETAXEL CHEMO INJECTION 160 MG/16ML
75.0000 mg/m2 | Freq: Once | INTRAVENOUS | Status: AC
  Administered 2016-06-24: 130 mg via INTRAVENOUS
  Filled 2016-06-24: qty 13

## 2016-06-24 MED ORDER — HEPARIN SOD (PORK) LOCK FLUSH 100 UNIT/ML IV SOLN
500.0000 [IU] | Freq: Once | INTRAVENOUS | Status: AC | PRN
  Administered 2016-06-24: 500 [IU]
  Filled 2016-06-24: qty 5

## 2016-06-24 MED ORDER — PALONOSETRON HCL INJECTION 0.25 MG/5ML
0.2500 mg | Freq: Once | INTRAVENOUS | Status: AC
  Administered 2016-06-24: 0.25 mg via INTRAVENOUS

## 2016-06-24 MED ORDER — PALONOSETRON HCL INJECTION 0.25 MG/5ML
INTRAVENOUS | Status: AC
  Filled 2016-06-24: qty 5

## 2016-06-24 MED ORDER — DEXAMETHASONE SODIUM PHOSPHATE 10 MG/ML IJ SOLN
10.0000 mg | Freq: Once | INTRAMUSCULAR | Status: AC
  Administered 2016-06-24: 10 mg via INTRAVENOUS

## 2016-06-24 MED ORDER — CYCLOPHOSPHAMIDE CHEMO INJECTION 1 GM
600.0000 mg/m2 | Freq: Once | INTRAMUSCULAR | Status: AC
  Administered 2016-06-24: 1060 mg via INTRAVENOUS
  Filled 2016-06-24: qty 53

## 2016-06-24 MED ORDER — DEXAMETHASONE SODIUM PHOSPHATE 10 MG/ML IJ SOLN
INTRAMUSCULAR | Status: AC
  Filled 2016-06-24: qty 1

## 2016-06-24 NOTE — Progress Notes (Signed)
Hematology and Oncology Follow Up Visit  Laura Landry [id number] February 06, 1956 60 y.o. 06/24/2016   Principle Diagnosis:   Stage IIB (T2N1M0) invasive lobular carcinoma of the LEFT breast- ER+/HER2-;  Oncotype score is 26  Current Therapy:    Taxotere/Cytoxan - s/p cycle #2     Interim History:  Laura Landry is back for follow-up. She really has had no problems with chemotherapy today. She is not on Neulasta. I did give her some antibiotics with doxycycline. This seemed to help quite a bit with infection prevention.  She's had no issues with nausea or vomiting. She does have some mouth sores. She is on mouth rinse for this.  She has had no rashes. She's had no leg swelling. She's had no cough or shortness of breath. She's had no change in bowel or bladder habits.  Overall, her performance status is ECOG 1.  Medications:  Current Outpatient Prescriptions:  .  albuterol (PROVENTIL HFA;VENTOLIN HFA) 108 (90 Base) MCG/ACT inhaler, Inhale 1-2 puffs into the lungs every 6 (six) hours as needed for wheezing or shortness of breath., Disp: 1 Inhaler, Rfl: 0 .  Calcium Carbonate-Vitamin D (CALCIUM-VITAMIN D) 500-200 MG-UNIT tablet, Take 2 tablets by mouth daily. , Disp: , Rfl:  .  dexamethasone (DECADRON) 4 MG tablet, Take 2 tablets twice a day with food for 4 days.  Start day BEFORE each chemotherapy cycle., Disp: 80 tablet, Rfl: 0 .  Diphenhyd-Hydrocort-Nystatin (FIRST-DUKES MOUTHWASH) SUSP, SWISH AND SWALLOW 5ML BY MOUTH BEFORE MEALS AND AT BEDTIME, Disp: 240 mL, Rfl: 0 .  doxycycline (VIBRA-TABS) 100 MG tablet, Take 1 tablet (100 mg total) by mouth 2 (two) times daily., Disp: 28 tablet, Rfl: 4 .  hydrocortisone cream 1 %, Apply 1 application topically 2 (two) times daily as needed (nash)., Disp: , Rfl:  .  ibuprofen (ADVIL,MOTRIN) 200 MG tablet, Take 200 mg by mouth every 6 (six) hours as needed for mild pain (for pain.). , Disp: , Rfl:  .  levothyroxine (SYNTHROID, LEVOTHROID) 100 MCG tablet,  Take 1 tablet (100 mcg total) by mouth daily., Disp: 90 tablet, Rfl: 1 .  lidocaine-prilocaine (EMLA) cream, Apply to affected area once, Disp: 30 g, Rfl: 3 .  LORazepam (ATIVAN) 0.5 MG tablet, Take 1 tablet (0.5 mg total) by mouth every 6 (six) hours as needed (Nausea or vomiting)., Disp: 30 tablet, Rfl: 0 .  mometasone (ELOCON) 0.1 % ointment, Apply topically daily., Disp: 45 g, Rfl: 2 .  Multiple Vitamins-Minerals (ALIVE WOMENS 50+ PO), Take 1 tablet by mouth daily. , Disp: , Rfl:  .  naphazoline-pheniramine (NAPHCON-A) 0.025-0.3 % ophthalmic solution, Place 1 drop into both eyes 4 (four) times daily as needed for irritation or allergies., Disp: , Rfl:  .  ondansetron (ZOFRAN) 8 MG tablet, Take 1 tablet (8 mg total) by mouth 2 (two) times daily as needed for refractory nausea / vomiting. Start on day 3 after chemo., Disp: 30 tablet, Rfl: 1 .  prochlorperazine (COMPAZINE) 10 MG tablet, Take 1 tablet (10 mg total) by mouth every 6 (six) hours as needed (Nausea or vomiting)., Disp: 30 tablet, Rfl: 1 .  SALINE NASAL MIST NA, Place 1 spray into the nose daily as needed (nasal congestion)., Disp: , Rfl:   Allergies:  Allergies  Allergen Reactions  . Clindamycin/Lincomycin Swelling    THROAT SWELLS  . Eggs Or Egg-Derived Products Swelling and Other (See Comments)    ABDOMINAL PAIN SWELLING, SWELLING REACTION UNSPECIFIED    . Ciprofloxacin Rash  . Codeine Hives and  Swelling    SWELLING REACTION UNSPECIFIED   . Nickel Other (See Comments)    BLISTERS   . Penicillins Hives, Nausea And Vomiting, Swelling and Rash    SWELLING REACTION UNSPECIFIED  Has patient had PCN reaction causing immediate rash, facial/tongue/throat swelling, SOB or lightheadedness with hypotension: YES PCN reaction causing severe rash involving mucus membranes or skin necrosis: NO PCN reaction that required hospitalization NO PCN reaction occurring within the last 10 years: NO If all of the above answers are "NO", then  may proceed with Cephalosporin use.  . Sulfa Antibiotics Hives and Swelling    SWELLING REACTION UNSPECIFIED     Past Medical History, Surgical history, Social history, and Family History were reviewed and updated.  Review of Systems:  As above  Physical Exam:  height is '5\' 5"'  (1.651 m) and weight is 149 lb (67.6 kg). Her oral temperature is 98 F (36.7 C). Her blood pressure is 144/97 (abnormal) and her pulse is 82. Her respiration is 16.   Wt Readings from Last 3 Encounters:  06/24/16 149 lb (67.6 kg)  05/11/16 150 lb 1.6 oz (68.1 kg)  05/07/16 148 lb (67.1 kg)      Well-developed well-nourished white female in no obvious distress. Head and neck exam shows no ocular or oral lesions. She has no palpable cervical or supraclavicular lymph nodes. Lungs are clear bilaterally. Cardiac exam regular rate and rhythm with no murmurs, rubs or bruits. Breast exam shows right breast with no masses, edema or erythema. There is no right axillary adenopathy. Left chest wall shows the nipple sparing mastectomy. She has an implant in place. She has no erythema about the implant. She has no palpable nodules. There is no palpable left axillary adenopathy. Abdomen soft. She has good bowel sounds. There is no fluid wave. There is no palpable liver or spleen tip. Active exam shows no tenderness over the spine, ribs or hips. External shows no clubbing, cyanosis or edema. There is no lymphedema of the left arm. Skin exam shows no rashes, ecchymoses or petechia. Neurological exam shows no focal neurological deficits.  Lab Results  Component Value Date   WBC 22.7 (H) 06/24/2016   HGB 12.8 06/24/2016   HCT 38.3 06/24/2016   MCV 100 06/24/2016   PLT 325 06/24/2016     Chemistry      Component Value Date/Time   NA 142 06/24/2016 1109   NA 141 03/18/2016 1223   K 4.1 06/24/2016 1109   K 3.8 03/18/2016 1223   CL 106 06/24/2016 1109   CO2 26 06/24/2016 1109   CO2 23 03/18/2016 1223   BUN 9 06/24/2016 1109    BUN 6.9 (L) 03/18/2016 1223   CREATININE 0.7 06/24/2016 1109   CREATININE 0.8 03/18/2016 1223   GLU 82 05/07/2015      Component Value Date/Time   CALCIUM 10.2 06/24/2016 1109   CALCIUM 10.9 (H) 03/18/2016 1223   ALKPHOS 63 06/24/2016 1109   ALKPHOS 62 03/18/2016 1223   AST 22 06/24/2016 1109   AST 20 03/18/2016 1223   ALT 16 06/24/2016 1109   ALT 15 03/18/2016 1223   BILITOT 0.40 06/24/2016 1109   BILITOT 0.60 03/18/2016 1223         Impression and Plan: Ms. Schwarz is A 60 year old postmenopausal white female with a stage IIb invasive lobular carcinoma the left breast. Her tumor is ER positive. She does have the high Oncotype score which is why we are using chemotherapy.  We will go ahead  with cycle #3 of Taxotere/Cytoxan. Again she will not get Neulasta. She understands quite well the high-risk of infection that she has by not using Neulasta. Hopefully, the doxycycline will continue to help. Again she has quite a few antibiotic allergies so we are limited as to what we can use.  I do not see that she is going need any radiation therapy. She had 1 lymph node that was positive. She had a tumor that was only 2.5 cm in size.  I may also talked to her about Prolia. This has been shown to decrease the risk of recurrence.  I will plan to get her back to see [place] in another 3 weeks. We'll see her back and we'll be her fourth and final cycle of treatment.  I spent about 25 minutes with she and her husband.   [name], MD 11/22/201712:13 PM

## 2016-06-24 NOTE — Patient Instructions (Signed)
Dexamethasone injection What is this medicine? DEXAMETHASONE (dex a METH a sone) is a corticosteroid. It is used to treat inflammation of the skin, joints, lungs, and other organs. Common conditions treated include asthma, allergies, and arthritis. It is also used for other conditions, like blood disorders and diseases of the adrenal glands. This medicine may be used for other purposes; ask your health care provider or pharmacist if you have questions. COMMON BRAND NAME(S): Decadron, DoubleDex, Simplist Dexamethasone, Solurex What should I tell my health care provider before I take this medicine? They need to know if you have any of these conditions: -blood clotting problems -Cushing's syndrome -diabetes -glaucoma -heart problems or disease -high blood pressure -infection like herpes, measles, tuberculosis, or chickenpox -kidney disease -liver disease -mental problems -myasthenia gravis -osteoporosis -previous heart attack -seizures -stomach, ulcer or intestine disease including colitis and diverticulitis -thyroid problem -an unusual or allergic reaction to dexamethasone, corticosteroids, other medicines, lactose, foods, dyes, or preservatives -pregnant or trying to get pregnant -breast-feeding How should I use this medicine? This medicine is for injection into a muscle, joint, lesion, soft tissue, or vein. It is given by a health care professional in a hospital or clinic setting. Talk to your pediatrician regarding the use of this medicine in children. Special care may be needed. Overdosage: If you think you have taken too much of this medicine contact a poison control center or emergency room at once. NOTE: This medicine is only for you. Do not share this medicine with others. What if I miss a dose? This may not apply. If you are having a series of injections over a prolonged period, try not to miss an appointment. Call your doctor or health care professional to reschedule if you  are unable to keep an appointment. What may interact with this medicine? Do not take this medicine with any of the following medications: -mifepristone, RU-486 -vaccines This medicine may also interact with the following medications: -amphotericin B -antibiotics like clarithromycin, erythromycin, and troleandomycin -aspirin and aspirin-like drugs -barbiturates like phenobarbital -carbamazepine -cholestyramine -cholinesterase inhibitors like donepezil, galantamine, rivastigmine, and tacrine -cyclosporine -digoxin -diuretics -ephedrine -female hormones, like estrogens or progestins and birth control pills -indinavir -isoniazid -ketoconazole -medicines for diabetes -medicines that improve muscle tone or strength for conditions like myasthenia gravis -NSAIDs, medicines for pain and inflammation, like ibuprofen or naproxen -phenytoin -rifampin -thalidomide -warfarin This list may not describe all possible interactions. Give your health care provider a list of all the medicines, herbs, non-prescription drugs, or dietary supplements you use. Also tell them if you smoke, drink alcohol, or use illegal drugs. Some items may interact with your medicine. What should I watch for while using this medicine? Your condition will be monitored carefully while you are receiving this medicine. If you are taking this medicine for a long time, carry an identification card with your name and address, the type and dose of your medicine, and your doctor's name and address. This medicine may increase your risk of getting an infection. Stay away from people who are sick. Tell your doctor or health care professional if you are around anyone with measles or chickenpox. Talk to your health care provider before you get any vaccines that you take this medicine. If you are going to have surgery, tell your doctor or health care professional that you have taken this medicine within the last twelve months. Ask your  doctor or health care professional about your diet. You may need to lower the amount of salt you eat. The  medicine can increase your blood sugar. If you are a diabetic check with your doctor if you need help adjusting the dose of your diabetic medicine. What side effects may I notice from receiving this medicine? Side effects that you should report to your doctor or health care professional as soon as possible: -allergic reactions like skin rash, itching or hives, swelling of the face, lips, or tongue -black or tarry stools -change in the amount of urine -changes in vision -confusion, excitement, restlessness, a false sense of well-being -fever, sore throat, sneezing, cough, or other signs of infection, wounds that will not heal -hallucinations -increased thirst -mental depression, mood swings, mistaken feelings of self importance or of being mistreated -pain in hips, back, ribs, arms, shoulders, or legs -pain, redness, or irritation at the injection site -redness, blistering, peeling or loosening of the skin, including inside the mouth -rounding out of face -swelling of feet or lower legs -unusual bleeding or bruising -unusual tired or weak -wounds that do not heal Side effects that usually do not require medical attention (report to your doctor or health care professional if they continue or are bothersome): -diarrhea or constipation -change in taste -headache -nausea, vomiting -skin problems, acne, thin and shiny skin -touble sleeping -unusual growth of hair on the face or body -weight gain This list may not describe all possible side effects. Call your doctor for medical advice about side effects. You may report side effects to FDA at 1-800-FDA-1088. Where should I keep my medicine? This drug is given in a hospital or clinic and will not be stored at home. NOTE: This sheet is a summary. It may not cover all possible information. If you have questions about this medicine, talk to  your doctor, pharmacist, or health care provider.  2017 Elsevier/[employer] Standard (2007-11-10 14:04:12) Palonosetron Injection What is this medicine? PALONOSETRON (pal oh NOE se tron) is used to prevent nausea and vomiting caused by chemotherapy. It also helps prevent delayed nausea and vomiting that may occur a few days after your treatment. This medicine may be used for other purposes; ask your health care provider or pharmacist if you have questions. COMMON BRAND NAME(S): Aloxi What should I tell my health care provider before I take this medicine? They need to know if you have any of these conditions: -an unusual or allergic reaction to palonosetron, dolasetron, granisetron, ondansetron, other medicines, foods, dyes, or preservatives -pregnant or trying to get pregnant -breast-feeding How should I use this medicine? This medicine is for infusion into a vein. It is given by a health care professional in a hospital or clinic setting. Talk to your pediatrician regarding the use of this medicine in children. While this drug may be prescribed for children as young as 1 month for selected conditions, precautions do apply. Overdosage: If you think you have taken too much of this medicine contact a poison control center or emergency room at once. NOTE: This medicine is only for you. Do not share this medicine with others. What if I miss a dose? This does not apply. What may interact with this medicine? -certain medicines for depression, anxiety, or psychotic disturbances -fentanyl -linezolid -MAOIs like Carbex, Eldepryl, Marplan, Nardil, and Parnate -methylene blue (injected into a vein) -tramadol This list may not describe all possible interactions. Give your health care provider a list of all the medicines, herbs, non-prescription drugs, or dietary supplements you use. Also tell them if you smoke, drink alcohol, or use illegal drugs. Some items may interact with your medicine.  What should I  watch for while using this medicine? Your condition will be monitored carefully while you are receiving this medicine. What side effects may I notice from receiving this medicine? Side effects that you should report to your doctor or health care professional as soon as possible: -allergic reactions like skin rash, itching or hives, swelling of the face, lips, or tongue -breathing problems -confusion -dizziness -fast, irregular heartbeat -fever and chills -loss of balance or coordination -seizures -sweating -swelling of the hands and feet -tremors -unusually weak or tired Side effects that usually do not require medical attention (report to your doctor or health care professional if they continue or are bothersome): -constipation or diarrhea -headache This list may not describe all possible side effects. Call your doctor for medical advice about side effects. You may report side effects to FDA at 1-800-FDA-1088. Where should I keep my medicine? This drug is given in a hospital or clinic and will not be stored at home. NOTE: This sheet is a summary. It may not cover all possible information. If you have questions about this medicine, talk to your doctor, pharmacist, or health care provider.  2017 Elsevier/Gold Standard (2013-05-26 10:38:36) Docetaxel injection What is this medicine? DOCETAXEL (doe se TAX el) is a chemotherapy drug. It targets fast dividing cells, like cancer cells, and causes these cells to die. This medicine is used to treat many types of cancers like breast cancer, certain stomach cancers, head and neck cancer, lung cancer, and prostate cancer. This medicine may be used for other purposes; ask your health care provider or pharmacist if you have questions. COMMON BRAND NAME(S): Docefrez, Taxotere What should I tell my health care provider before I take this medicine? They need to know if you have any of these conditions: -infection (especially a virus infection such as  chickenpox, cold sores, or herpes) -liver disease -low blood counts, like low white cell, platelet, or red cell counts -an unusual or allergic reaction to docetaxel, polysorbate 80, other chemotherapy agents, other medicines, foods, dyes, or preservatives -pregnant or trying to get pregnant -breast-feeding How should I use this medicine? This drug is given as an infusion into a vein. It is administered in a hospital or clinic by a specially trained health care professional. Talk to your pediatrician regarding the use of this medicine in children. Special care may be needed. Overdosage: If you think you have taken too much of this medicine contact a poison control center or emergency room at once. NOTE: This medicine is only for you. Do not share this medicine with others. What if I miss a dose? It is important not to miss your dose. Call your doctor or health care professional if you are unable to keep an appointment. What may interact with this medicine? -cyclosporine -erythromycin -ketoconazole -medicines to increase blood counts like filgrastim, pegfilgrastim, sargramostim -vaccines Talk to your doctor or health care professional before taking any of these medicines: -acetaminophen -aspirin -ibuprofen -ketoprofen -naproxen This list may not describe all possible interactions. Give your health care provider a list of all the medicines, herbs, non-prescription drugs, or dietary supplements you use. Also tell them if you smoke, drink alcohol, or use illegal drugs. Some items may interact with your medicine. What should I watch for while using this medicine? Your condition will be monitored carefully while you are receiving this medicine. You will need important blood work done while you are taking this medicine. This drug may make you feel generally unwell. This is not uncommon,  as chemotherapy can affect healthy cells as well as cancer cells. Report any side effects. Continue your course  of treatment even though you feel ill unless your doctor tells you to stop. In some cases, you may be given additional medicines to help with side effects. Follow all directions for their use. Call your doctor or health care professional for advice if you get a fever, chills or sore throat, or other symptoms of a cold or flu. Do not treat yourself. This drug decreases your body's ability to fight infections. Try to avoid being around people who are sick. This medicine may increase your risk to bruise or bleed. Call your doctor or health care professional if you notice any unusual bleeding. This medicine may contain alcohol in the product. You may get drowsy or dizzy. Do not drive, use machinery, or do anything that needs mental alertness until you know how this medicine affects you. Do not stand or sit up quickly, especially if you are an older patient. This reduces the risk of dizzy or fainting spells. Avoid alcoholic drinks. Do not become pregnant while taking this medicine. Women should inform their doctor if they wish to become pregnant or think they might be pregnant. There is a potential for serious side effects to an unborn child. Talk to your health care professional or pharmacist for more information. Do not breast-feed an infant while taking this medicine. What side effects may I notice from receiving this medicine? Side effects that you should report to your doctor or health care professional as soon as possible: -allergic reactions like skin rash, itching or hives, swelling of the face, lips, or tongue -low blood counts - This drug may decrease the number of white blood cells, red blood cells and platelets. You may be at increased risk for infections and bleeding. -signs of infection - fever or chills, cough, sore throat, pain or difficulty passing urine -signs of decreased platelets or bleeding - bruising, pinpoint red spots on the skin, black, tarry stools, nosebleeds -signs of decreased  red blood cells - unusually weak or tired, fainting spells, lightheadedness -breathing problems -fast or irregular heartbeat -low blood pressure -mouth sores -nausea and vomiting -pain, swelling, redness or irritation at the injection site -pain, tingling, numbness in the hands or feet -swelling of the ankle, feet, hands -weight gain Side effects that usually do not require medical attention (report to your doctor or health care professional if they continue or are bothersome): -bone pain -complete hair loss including hair on your head, underarms, pubic hair, eyebrows, and eyelashes -diarrhea -excessive tearing -changes in the color of fingernails -loosening of the fingernails -nausea -muscle pain -red flush to skin -sweating -weak or tired This list may not describe all possible side effects. Call your doctor for medical advice about side effects. You may report side effects to FDA at 1-800-FDA-1088. Where should I keep my medicine? This drug is given in a hospital or clinic and will not be stored at home. NOTE: This sheet is a summary. It may not cover all possible information. If you have questions about this medicine, talk to your doctor, pharmacist, or health care provider.  2017 Elsevier/Gold Standard (2015-08-22 12:32:56) Cyclophosphamide injection What is this medicine? CYCLOPHOSPHAMIDE (sye kloe FOSS fa mide) is a chemotherapy drug. It slows the growth of cancer cells. This medicine is used to treat many types of cancer like lymphoma, myeloma, leukemia, breast cancer, and ovarian cancer, to name a few. This medicine may be used for other purposes;  ask your health care provider or pharmacist if you have questions. COMMON BRAND NAME(S): Cytoxan, Neosar What should I tell my health care provider before I take this medicine? They need to know if you have any of these conditions: -blood disorders -history of other chemotherapy -infection -kidney disease -liver  disease -recent or ongoing radiation therapy -tumors in the bone marrow -an unusual or allergic reaction to cyclophosphamide, other chemotherapy, other medicines, foods, dyes, or preservatives -pregnant or trying to get pregnant -breast-feeding How should I use this medicine? This drug is usually given as an injection into a vein or muscle or by infusion into a vein. It is administered in a hospital or clinic by a specially trained health care professional. Talk to your pediatrician regarding the use of this medicine in children. Special care may be needed. Overdosage: If you think you have taken too much of this medicine contact a poison control center or emergency room at once. NOTE: This medicine is only for you. Do not share this medicine with others. What if I miss a dose? It is important not to miss your dose. Call your doctor or health care professional if you are unable to keep an appointment. What may interact with this medicine? This medicine may interact with the following medications: -amiodarone -amphotericin B -azathioprine -certain antiviral medicines for HIV or AIDS such as protease inhibitors (e.g., indinavir, ritonavir) and zidovudine -certain blood pressure medications such as benazepril, captopril, enalapril, fosinopril, lisinopril, moexipril, monopril, perindopril, quinapril, ramipril, trandolapril -certain cancer medications such as anthracyclines (e.g., daunorubicin, doxorubicin), busulfan, cytarabine, paclitaxel, pentostatin, tamoxifen, trastuzumab -certain diuretics such as chlorothiazide, chlorthalidone, hydrochlorothiazide, indapamide, metolazone -certain medicines that treat or prevent blood clots like warfarin -certain muscle relaxants such as succinylcholine -cyclosporine -etanercept -indomethacin -medicines to increase blood counts like filgrastim, pegfilgrastim, sargramostim -medicines used as general anesthesia -metronidazole -natalizumab This list may  not describe all possible interactions. Give your health care provider a list of all the medicines, herbs, non-prescription drugs, or dietary supplements you use. Also tell them if you smoke, drink alcohol, or use illegal drugs. Some items may interact with your medicine. What should I watch for while using this medicine? Visit your doctor for checks on your progress. This drug may make you feel generally unwell. This is not uncommon, as chemotherapy can affect healthy cells as well as cancer cells. Report any side effects. Continue your course of treatment even though you feel ill unless your doctor tells you to stop. Drink water or other fluids as directed. Urinate often, even at night. In some cases, you may be given additional medicines to help with side effects. Follow all directions for their use. Call your doctor or health care professional for advice if you get a fever, chills or sore throat, or other symptoms of a cold or flu. Do not treat yourself. This drug decreases your body's ability to fight infections. Try to avoid being around people who are sick. This medicine may increase your risk to bruise or bleed. Call your doctor or health care professional if you notice any unusual bleeding. Be careful brushing and flossing your teeth or using a toothpick because you may get an infection or bleed more easily. If you have any dental work done, tell your dentist you are receiving this medicine. You may get drowsy or dizzy. Do not drive, use machinery, or do anything that needs mental alertness until you know how this medicine affects you. Do not become pregnant while taking this medicine or for 1  year after stopping it. Women should inform their doctor if they wish to become pregnant or think they might be pregnant. Men should not father a child while taking this medicine and for 4 months after stopping it. There is a potential for serious side effects to an unborn child. Talk to your health care  professional or pharmacist for more information. Do not breast-feed an infant while taking this medicine. This medicine may interfere with the ability to have a child. This medicine has caused ovarian failure in some women. This medicine has caused reduced sperm counts in some men. You should talk with your doctor or health care professional if you are concerned about your fertility. If you are going to have surgery, tell your doctor or health care professional that you have taken this medicine. What side effects may I notice from receiving this medicine? Side effects that you should report to your doctor or health care professional as soon as possible: -allergic reactions like skin rash, itching or hives, swelling of the face, lips, or tongue -low blood counts - this medicine may decrease the number of white blood cells, red blood cells and platelets. You may be at increased risk for infections and bleeding. -signs of infection - fever or chills, cough, sore throat, pain or difficulty passing urine -signs of decreased platelets or bleeding - bruising, pinpoint red spots on the skin, black, tarry stools, blood in the urine -signs of decreased red blood cells - unusually weak or tired, fainting spells, lightheadedness -breathing problems -dark urine -dizziness -palpitations -swelling of the ankles, feet, hands -trouble passing urine or change in the amount of urine -weight gain -yellowing of the eyes or skin Side effects that usually do not require medical attention (report to your doctor or health care professional if they continue or are bothersome): -changes in nail or skin color -hair loss -missed menstrual periods -mouth sores -nausea, vomiting This list may not describe all possible side effects. Call your doctor for medical advice about side effects. You may report side effects to FDA at 1-800-FDA-1088. Where should I keep my medicine? This drug is given in a hospital or clinic and  will not be stored at home. NOTE: This sheet is a summary. It may not cover all possible information. If you have questions about this medicine, talk to your doctor, pharmacist, or health care provider.  2017 Elsevier/[employer] Standard (2012-06-03 16:22:58)

## 2016-06-24 NOTE — Addendum Note (Signed)
Addended by: Burney Gauze R on: 06/24/2016 12:22 PM   Modules accepted: Orders

## 2016-06-26 ENCOUNTER — Ambulatory Visit: Payer: BLUE CROSS/BLUE SHIELD

## 2016-07-01 ENCOUNTER — Telehealth: Payer: Self-pay | Admitting: Family Medicine

## 2016-07-01 DIAGNOSIS — E038 Other specified hypothyroidism: Secondary | ICD-10-CM

## 2016-07-01 NOTE — Telephone Encounter (Signed)
Patient was told to f/u with specialist (Dr. Ronnald Collum) by Dr. Jenetta Downer. Patient called his office and was told we need to send a referral over before she can be sch

## 2016-07-01 NOTE — Addendum Note (Signed)
Addended by: Fonnie Mu on: 07/01/2016 02:51 PM   Modules accepted: Orders

## 2016-07-06 ENCOUNTER — Encounter: Payer: Self-pay | Admitting: Radiation Oncology

## 2016-07-06 ENCOUNTER — Other Ambulatory Visit: Payer: Self-pay | Admitting: *Deleted

## 2016-07-13 ENCOUNTER — Encounter: Payer: Self-pay | Admitting: Radiation Oncology

## 2016-07-13 ENCOUNTER — Ambulatory Visit
Admission: RE | Admit: 2016-07-13 | Discharge: 2016-07-13 | Disposition: A | Discharge status: Home / Self Care | Payer: BLUE CROSS/BLUE SHIELD | Source: Ambulatory Visit | Attending: Radiation Oncology | Admitting: Radiation Oncology

## 2016-07-13 DIAGNOSIS — Z880 Allergy status to penicillin: Secondary | ICD-10-CM | POA: Diagnosis not present

## 2016-07-13 DIAGNOSIS — C50312 Malignant neoplasm of lower-inner quadrant of left female breast: Secondary | ICD-10-CM

## 2016-07-13 DIAGNOSIS — Z9889 Other specified postprocedural states: Secondary | ICD-10-CM | POA: Diagnosis not present

## 2016-07-13 DIAGNOSIS — D0502 Lobular carcinoma in situ of left breast: Secondary | ICD-10-CM | POA: Insufficient documentation

## 2016-07-13 DIAGNOSIS — Z91012 Allergy to eggs: Secondary | ICD-10-CM | POA: Diagnosis not present

## 2016-07-13 DIAGNOSIS — Z79899 Other long term (current) drug therapy: Secondary | ICD-10-CM | POA: Diagnosis not present

## 2016-07-13 NOTE — Progress Notes (Signed)
Radiation Oncology         [phone] ________________________________  Re-evaluation Visit  Name: Laura Landry MRN: [id number]  Date: 07/13/2016  DOB: Sep 16, 1955  CH:ENIDPOE Opalski, DO  Opalski, Neoma Laming, DO   REFERRING PHYSICIAN: Mellody Dance, DO  DIAGNOSIS: The encounter diagnosis was Malignant neoplasm of lower-inner quadrant of left female breast, unspecified estrogen receptor status ([place]). Stage IIB (T2N1M0) invasive lobular carcinoma of the left breast  HISTORY OF PRESENT ILLNESS::Laura Landry is a 60 y.o. female who had an abnormal screening mammogram. Biopsy of the left breast on 03/04/16 revealed invasive mammary carcinoma in situ. Receptor status was ER (95%) PR (1%) Her2 (-) Ki67 (30%).  Since breast clinic, patient underwent left, nipple sparing, mastectomy with left axillary sentinel lymph node biopsy on 04/14/16. Mastectomy revealed invasive lobular carcinoma, grade 2, spanning 2.5 cm in greatest diameter. Resection margin is focally positive at posterior margin (0.32m). Nipple biopsy revealed focal usual ductal hyperplasia, negative for malignancy. Sentinel lymph node biopsy revealed metastatic lobular carcinoma (0.8 cm, 1/1). Then she had left breast reconstruction with placement of tissue expander. Patient is to finish her last chemotherapy cycle of Taxotere/Cytoxan on Wednesday.  Patient denies lymphedema or pain issues at this time.   PREVIOUS RADIATION THERAPY: No  PAST MEDICAL HISTORY:  has a past medical history of Anemia; Anxiety; Asthma; Breast cancer of lower-inner quadrant of left female breast ([place] (03/09/2016); Colitis; Fatigue; Glaucoma; Hypothyroidism; and PONV (postoperative nausea and vomiting).    PAST SURGICAL HISTORY: Past Surgical History:  Procedure Laterality Date  . BREAST RECONSTRUCTION WITH PLACEMENT OF TISSUE EXPANDER AND FLEX HD (ACELLULAR HYDRATED DERMIS) Left 04/14/2016   Procedure: LEFT BREAST RECONSTRUCTION WITH PLACEMENT OF TISSUE  EXPANDER;  Surgeon: BIrene Limbo MD;  [facility]  Service: Plastics;  Laterality: Left;  . BREAST SURGERY Left 04/14/2016   mastectomy  . COLONOSCOPY W/ POLYPECTOMY    . EYE SURGERY Left    laser surgery due to high  pressure  . NIPPLE SPARING MASTECTOMY/SENTINAL LYMPH NODE BIOPSY/RECONSTRUCTION/PLACEMENT OF TISSUE EXPANDER Left 04/14/2016   Procedure: LEFT NIPPLE SPARING MASTECTOMY WITH LEFT AXILLARY SENTINEL LYMPH NODE BIOPSY;  Surgeon: MRolm Bookbinder MD;  [place] [facility]  Service: General;  Laterality: Left;  . PORTACATH PLACEMENT N/A 05/07/2016   Procedure: INSERTION PORT-A-CATH WITH [place]  Surgeon: MRolm Bookbinder MD;  [place] [facility]  Service: General;  Laterality: N/A;  . WISDOM TOOTH EXTRACTION      FAMILY HISTORY: family history includes Asthma in her father; Cancer in her mother and paternal grandmother; Colon cancer in her paternal grandmother; Diabetes in her brother; Hypertension in her father; Ovarian cancer in her maternal aunt and paternal aunt.  SOCIAL HISTORY:  reports that she quit smoking about 3 years ago. She has never used smokeless tobacco. She reports that she drinks about 2.4 oz of alcohol per week . She reports that she does not use drugs.  ALLERGIES: Clindamycin/lincomycin; Eggs or egg-derived products; Ciprofloxacin; Codeine; Nickel; Penicillins; Sulfa antibiotics; and Eszopiclone  MEDICATIONS:  Current Outpatient Prescriptions  Medication Sig Dispense Refill  . albuterol (PROVENTIL HFA;VENTOLIN HFA) 108 (90 Base) MCG/ACT inhaler Inhale 1-2 puffs into the lungs every 6 (six) hours as needed for wheezing or shortness of breath. 1 Inhaler 0  . Calcium Carbonate-Vitamin D (CALCIUM-VITAMIN D) 500-200 MG-UNIT tablet Take 2 tablets by mouth daily.     .Marland Kitchendexamethasone (DECADRON) 4 MG tablet Take 2 tablets twice a day with food for 4 days.  Start day BEFORE each chemotherapy cycle. 80  tablet 0  . Diphenhyd-Hydrocort-Nystatin (FIRST-DUKES MOUTHWASH) SUSP  SWISH AND SWALLOW 5ML BY MOUTH BEFORE MEALS AND AT BEDTIME 240 mL 0  . doxycycline (VIBRA-TABS) 100 MG tablet Take 1 tablet (100 mg total) by mouth 2 (two) times daily. 28 tablet 4  . levothyroxine (SYNTHROID, LEVOTHROID) 100 MCG tablet Take 1 tablet (100 mcg total) by mouth daily. 90 tablet 1  . lidocaine-prilocaine (EMLA) cream Apply to affected area once 30 g 3  . mometasone (ELOCON) 0.1 % ointment Apply topically daily. 45 g 2  . Multiple Vitamins-Minerals (ALIVE WOMENS 50+ PO) Take 1 tablet by mouth daily.     . naphazoline-pheniramine (NAPHCON-A) 0.025-0.3 % ophthalmic solution Place 1 drop into both eyes 4 (four) times daily as needed for irritation or allergies.    Marland Kitchen SALINE NASAL MIST NA Place 1 spray into the nose daily as needed (nasal congestion).    . hydrocortisone cream 1 % Apply 1 application topically 2 (two) times daily as needed (nash).    Marland Kitchen ibuprofen (ADVIL,MOTRIN) 200 MG tablet Take 200 mg by mouth every 6 (six) hours as needed for mild pain (for pain.).     Marland Kitchen LORazepam (ATIVAN) 0.5 MG tablet Take 1 tablet (0.5 mg total) by mouth every 6 (six) hours as needed (Nausea or vomiting). (Patient not taking: Reported on 07/13/2016) 30 tablet 0  . ondansetron (ZOFRAN) 8 MG tablet Take 1 tablet (8 mg total) by mouth 2 (two) times daily as needed for refractory nausea / vomiting. Start on day 3 after chemo. (Patient not taking: Reported on 07/13/2016) 30 tablet 1  . prochlorperazine (COMPAZINE) 10 MG tablet Take 1 tablet (10 mg total) by mouth every 6 (six) hours as needed (Nausea or vomiting). (Patient not taking: Reported on 07/13/2016) 30 tablet 1   No current facility-administered medications for this encounter.     REVIEW OF SYSTEMS:  A 15 point review of systems is documented in the electronic medical record. This was obtained by the nursing staff. However, I reviewed this with the patient to discuss relevant findings and make appropriate changes.   PHYSICAL EXAM:  height is '5\' 5"'   (1.651 m) and weight is 149 lb 6.4 oz (67.8 kg). Her oral temperature is 98.2 F (36.8 C). Her blood pressure is 127/95 (abnormal) and her pulse is 101 (abnormal). Her oxygen saturation is 100%.   Patient did not wish to have an exam.   ECOG = 1  LABORATORY DATA:  Lab Results  Component Value Date   WBC 22.7 (H) 06/24/2016   HGB 12.8 06/24/2016   HCT 38.3 06/24/2016   MCV 100 06/24/2016   PLT 325 06/24/2016   NEUTROABS 21.3 (H) 06/24/2016   Lab Results  Component Value Date   NA 142 06/24/2016   K 4.1 06/24/2016   CL 106 06/24/2016   CO2 26 06/24/2016   GLUCOSE 119 (H) 06/24/2016   CREATININE 0.7 06/24/2016   CALCIUM 10.2 06/24/2016      RADIOGRAPHY: No results found.    IMPRESSION: T2N1a invasive lobular carcinoma of the left breast. Patient did not have a full axillary dissection at the time of her surgery, and therefore would be at risk for local regional recurrence without additional treatment. The patient also was found to have focally positive posterior margin and a close anterior margin. I would recommend radiation to the chest wall in addition to axillary radiation because the axilla has not had a full axillary dissection. We discussed the course of treatment, side effects, and  toxicities. After careful consideration, the patient does not wish to proceed with radiation therapy as part of her management. She understands she is at higher risk for local regional recurrence in light of this decision. She is not willing to proceed with radiation therapy given the potential effects of this treatment. She does not wish to have an axillary dissection.  PLAN: Prn follow-up with radiation oncology. She will continue adjuvant chemotherapy, and follow up with medical oncology as well as Dr. Donne Hazel, and Dr. Iran Planas.     ------------------------------------------------  [name], PhD, MD  This document serves as a record of services personally performed by Gery Pray,  MD. It was created on his behalf by Bethann Humble, a trained medical scribe. The creation of this record is based on the scribe's personal observations and the provider's statements to them. This document has been checked and approved by the attending provider.

## 2016-07-13 NOTE — Progress Notes (Addendum)
Location of Breast Cancer: Location of Breast Cancer: Stage IIB (T2N1M0) invasive lobular carcinoma of the LEFT breast  Histology per Pathology Report:   04/14/16 Diagnosis 1. Breast, simple mastectomy, Left nipple sparing INVASIVE LOBULAR CARCINOMA, GRADE 2, SPANNING 2.5 CM MARGIN OF RESECTION IS FOCALLY POSITIVE AT POSTERIOR MARGIN (0.2 MM) SMALL FOCUS OF INVASIVE LOBULAR CARCINOMA IS WITHIN 1 MM TO THE ANTERIOR MARGIN LOBULAR CARCINOMA IN SITU IS PRESENT 2. Nipple Biopsy FOCAL USUAL DUCTAL HYPERPLASIA NEGATIVE FOR MALIGNANCY 3. Lymph node, sentinel, biopsy METASTATIC LOBULAR CARCINOMA (1/1, 0.8 CM)  03/04/16 Diagnosis Breast, left, needle core biopsy - INVASIVE MAMMARY CARCINOMA. - MAMMARY CARCINOMA IN SITU. Receptor Status: ER(95%), PR (1%), Her2-neu (negative), Ki-(30%)  Did patient present with symptoms (if so, please note symptoms) or was this found on screening mammography?: screening mammogram  Past/Anticipated interventions by surgeon, if any: 04/14/16 - Procedure: LEFT NIPPLE SPARING MASTECTOMY WITH LEFT AXILLARY SENTINEL LYMPH NODE BIOPSY;  Surgeon: Rolm Bookbinder, MD and Procedure: LEFT BREAST RECONSTRUCTION WITH PLACEMENT OF TISSUE EXPANDER;  Surgeon: Irene Limbo, MD  Past/Anticipated interventions by medical oncology, if any: Taxotere/Cytoxan.  She will have her last chemotherapy cycle on Wednesday.   Lymphedema issues, if any:  no  Pain issues, if any:  no  SAFETY ISSUES:  Prior radiation? no  Pacemaker/ICD? no  Possible current pregnancy?no  Is the patient on methotrexate? no  Current Complaints / other details:  Patient stated that she is only here today to learn about radiation because Dr.Thimmappa wants to make sure she is informed about her options.    BP (!) 127/95 (BP Location: Right Arm, Patient Position: Sitting)   Pulse (!) 101   Temp 98.2 F (36.8 C) (Oral)   Ht '5\' 5"'  (1.651 m)   Wt 149 lb 6.4 oz (67.8 kg)   SpO2 100%   BMI 24.86  kg/m    Wt Readings from Last 3 Encounters:  07/13/16 149 lb 6.4 oz (67.8 kg)  06/24/16 149 lb (67.6 kg)  05/11/16 150 lb 1.6 oz (68.1 kg)   Jacqulyn Liner, RN 07/13/2016,2:22 PM

## 2016-07-13 NOTE — Progress Notes (Signed)
Please see the Nurse Progress Note in the MD Initial Consult Encounter for this patient. 

## 2016-07-15 ENCOUNTER — Other Ambulatory Visit (HOSPITAL_BASED_OUTPATIENT_CLINIC_OR_DEPARTMENT_OTHER): Payer: BLUE CROSS/BLUE SHIELD

## 2016-07-15 ENCOUNTER — Ambulatory Visit (HOSPITAL_BASED_OUTPATIENT_CLINIC_OR_DEPARTMENT_OTHER): Payer: BLUE CROSS/BLUE SHIELD | Admitting: Hematology & Oncology

## 2016-07-15 ENCOUNTER — Ambulatory Visit (HOSPITAL_BASED_OUTPATIENT_CLINIC_OR_DEPARTMENT_OTHER): Payer: BLUE CROSS/BLUE SHIELD

## 2016-07-15 VITALS — BP 129/82 | HR 98 | Temp 98.3°F | Resp 20 | Wt 149.4 lb

## 2016-07-15 DIAGNOSIS — C50912 Malignant neoplasm of unspecified site of left female breast: Secondary | ICD-10-CM

## 2016-07-15 DIAGNOSIS — Z170 Estrogen receptor positive status [ER+]: Secondary | ICD-10-CM

## 2016-07-15 DIAGNOSIS — K1370 Unspecified lesions of oral mucosa: Secondary | ICD-10-CM | POA: Diagnosis not present

## 2016-07-15 DIAGNOSIS — C50312 Malignant neoplasm of lower-inner quadrant of left female breast: Secondary | ICD-10-CM

## 2016-07-15 DIAGNOSIS — Z5111 Encounter for antineoplastic chemotherapy: Secondary | ICD-10-CM

## 2016-07-15 DIAGNOSIS — C773 Secondary and unspecified malignant neoplasm of axilla and upper limb lymph nodes: Secondary | ICD-10-CM | POA: Diagnosis not present

## 2016-07-15 LAB — CMP (CANCER CENTER ONLY)
ALT(SGPT): 16 U/L (ref 10–47)
AST: 22 U/L (ref 11–38)
Albumin: 3.6 g/dL (ref 3.3–5.5)
Alkaline Phosphatase: 59 U/L (ref 26–84)
BUN, Bld: 8 mg/dL (ref 7–22)
CO2: 27 meq/L (ref 18–33)
Calcium: 10.3 mg/dL (ref 8.0–10.3)
Chloride: 105 meq/L (ref 98–108)
Creat: 0.8 mg/dL (ref 0.6–1.2)
Glucose, Bld: 138 mg/dL — ABNORMAL HIGH (ref 73–118)
Potassium: 3.7 meq/L (ref 3.3–4.7)
Sodium: 140 meq/L (ref 128–145)
Total Bilirubin: 0.4 mg/dL (ref 0.20–1.60)
Total Protein: 6.8 g/dL (ref 6.4–8.1)

## 2016-07-15 LAB — CBC WITH DIFFERENTIAL (CANCER CENTER ONLY)
BASO#: 0 10*3/uL (ref 0.0–0.2)
BASO%: 0.1 % (ref 0.0–2.0)
EOS%: 0 % (ref 0.0–7.0)
Eosinophils Absolute: 0 10*3/uL (ref 0.0–0.5)
HEMATOCRIT: 37.5 % (ref 34.8–46.6)
HGB: 12.5 g/dL (ref 11.6–15.9)
LYMPH#: 1.1 10*3/uL (ref 0.9–3.3)
LYMPH%: 8.3 % — AB (ref 14.0–48.0)
MCH: 32.9 pg (ref 26.0–34.0)
MCHC: 33.3 g/dL (ref 32.0–36.0)
MCV: 99 fL (ref 81–101)
MONO#: 0.5 10*3/uL (ref 0.1–0.9)
MONO%: 3.9 % (ref 0.0–13.0)
NEUT%: 87.7 % — ABNORMAL HIGH (ref 39.6–80.0)
NEUTROS ABS: 11.7 10*3/uL — AB (ref 1.5–6.5)
PLATELETS: 350 10*3/uL (ref 145–400)
RBC: 3.8 10*6/uL (ref 3.70–5.32)
RDW: 14.2 % (ref 11.1–15.7)
WBC: 13.3 10*3/uL — ABNORMAL HIGH (ref 3.9–10.0)

## 2016-07-15 MED ORDER — DEXAMETHASONE SODIUM PHOSPHATE 10 MG/ML IJ SOLN
10.0000 mg | Freq: Once | INTRAMUSCULAR | Status: AC
  Administered 2016-07-15: 10 mg via INTRAVENOUS

## 2016-07-15 MED ORDER — CYCLOPHOSPHAMIDE CHEMO INJECTION 1 GM
600.0000 mg/m2 | Freq: Once | INTRAMUSCULAR | Status: AC
  Administered 2016-07-15: 1060 mg via INTRAVENOUS
  Filled 2016-07-15: qty 53

## 2016-07-15 MED ORDER — SODIUM CHLORIDE 0.9 % IV SOLN
Freq: Once | INTRAVENOUS | Status: AC
  Administered 2016-07-15: 14:00:00 via INTRAVENOUS

## 2016-07-15 MED ORDER — HEPARIN SOD (PORK) LOCK FLUSH 100 UNIT/ML IV SOLN
500.0000 [IU] | Freq: Once | INTRAVENOUS | Status: AC | PRN
  Administered 2016-07-15: 500 [IU]
  Filled 2016-07-15: qty 5

## 2016-07-15 MED ORDER — DEXAMETHASONE SODIUM PHOSPHATE 10 MG/ML IJ SOLN
INTRAMUSCULAR | Status: AC
  Filled 2016-07-15: qty 1

## 2016-07-15 MED ORDER — DOCETAXEL CHEMO INJECTION 160 MG/16ML
75.0000 mg/m2 | Freq: Once | INTRAVENOUS | Status: AC
  Administered 2016-07-15: 130 mg via INTRAVENOUS
  Filled 2016-07-15: qty 13

## 2016-07-15 MED ORDER — PALONOSETRON HCL INJECTION 0.25 MG/5ML
INTRAVENOUS | Status: AC
  Filled 2016-07-15: qty 5

## 2016-07-15 MED ORDER — SODIUM CHLORIDE 0.9% FLUSH
10.0000 mL | INTRAVENOUS | Status: DC | PRN
  Administered 2016-07-15: 10 mL
  Filled 2016-07-15: qty 10

## 2016-07-15 MED ORDER — PALONOSETRON HCL INJECTION 0.25 MG/5ML
0.2500 mg | Freq: Once | INTRAVENOUS | Status: AC
  Administered 2016-07-15: 0.25 mg via INTRAVENOUS

## 2016-07-15 NOTE — Patient Instructions (Signed)
[facility] Discharge Instructions for Patients Receiving Chemotherapy  Today you received the following chemotherapy agents Taxotere and Cytoxan.  To help prevent nausea and vomiting after your treatment, we encourage you to take your nausea medication.   If you develop nausea and vomiting that is not controlled by your nausea medication, call the clinic.   BELOW ARE SYMPTOMS THAT SHOULD BE REPORTED IMMEDIATELY:  *FEVER GREATER THAN 100.5 F  *CHILLS WITH OR WITHOUT FEVER  NAUSEA AND VOMITING THAT IS NOT CONTROLLED WITH YOUR NAUSEA MEDICATION  *UNUSUAL SHORTNESS OF BREATH  *UNUSUAL BRUISING OR BLEEDING  TENDERNESS IN MOUTH AND THROAT WITH OR WITHOUT PRESENCE OF ULCERS  *URINARY PROBLEMS  *BOWEL PROBLEMS  UNUSUAL RASH Items with * indicate a potential emergency and should be followed up as soon as possible.  Feel free to call the clinic you have any questions or concerns. The clinic phone number is [phone].  Please show the CHEMO ALERT CARD at check-in to the Emergency Department and triage nurse.   

## 2016-07-15 NOTE — Progress Notes (Signed)
Hematology and Oncology Follow Up Visit  Laura Landry [id number] 11/26/1955 59 y.o. 07/15/2016   Principle Diagnosis:   Stage IIB (T2N1M0) invasive lobular carcinoma of the LEFT breast- ER+/HER2-;  Oncotype score is 26  Current Therapy:    Taxotere/Cytoxan - s/p cycle #3     Interim History:  Ms. Laura Landry is back for follow-up. She really has had no problems with chemotherapy today. She is not on Neulasta. I did give her some antibiotics with doxycycline. This seemed to help quite a bit with infection prevention.  She's had no issues with nausea or vomiting. She does have some mouth sores. She is on mouth rinse for this.  She has had no rashes. She's had no leg swelling. She's had no cough or shortness of breath. She's had no change in bowel or bladder habits.  Overall, her performance status is ECOG 1.  Medications:  Current Outpatient Prescriptions:  .  albuterol (PROVENTIL HFA;VENTOLIN HFA) 108 (90 Base) MCG/ACT inhaler, Inhale 1-2 puffs into the lungs every 6 (six) hours as needed for wheezing or shortness of breath., Disp: 1 Inhaler, Rfl: 0 .  Calcium Carbonate-Vitamin D (CALCIUM-VITAMIN D) 500-200 MG-UNIT tablet, Take 2 tablets by mouth daily. , Disp: , Rfl:  .  dexamethasone (DECADRON) 4 MG tablet, Take 2 tablets twice a day with food for 4 days.  Start day BEFORE each chemotherapy cycle., Disp: 80 tablet, Rfl: 0 .  doxycycline (VIBRA-TABS) 100 MG tablet, Take 1 tablet (100 mg total) by mouth 2 (two) times daily., Disp: 28 tablet, Rfl: 4 .  levothyroxine (SYNTHROID, LEVOTHROID) 100 MCG tablet, Take 1 tablet (100 mcg total) by mouth daily., Disp: 90 tablet, Rfl: 1 .  lidocaine-prilocaine (EMLA) cream, Apply to affected area once, Disp: 30 g, Rfl: 3 .  Multiple Vitamins-Minerals (ALIVE WOMENS 50+ PO), Take 1 tablet by mouth daily. , Disp: , Rfl:  .  naphazoline-pheniramine (NAPHCON-A) 0.025-0.3 % ophthalmic solution, Place 1 drop into both eyes 4 (four) times daily as needed for  irritation or allergies., Disp: , Rfl:  .  SALINE NASAL MIST NA, Place 1 spray into the nose daily as needed (nasal congestion)., Disp: , Rfl:  .  Diphenhyd-Hydrocort-Nystatin (FIRST-DUKES MOUTHWASH) SUSP, SWISH AND SWALLOW 5ML BY MOUTH BEFORE MEALS AND AT BEDTIME (Patient not taking: Reported on 07/15/2016), Disp: 240 mL, Rfl: 0 .  LORazepam (ATIVAN) 0.5 MG tablet, Take 1 tablet (0.5 mg total) by mouth every 6 (six) hours as needed (Nausea or vomiting). (Patient not taking: Reported on 07/15/2016), Disp: 30 tablet, Rfl: 0 .  mometasone (ELOCON) 0.1 % ointment, Apply topically daily. (Patient not taking: Reported on 07/15/2016), Disp: 45 g, Rfl: 2 .  ondansetron (ZOFRAN) 8 MG tablet, Take 1 tablet (8 mg total) by mouth 2 (two) times daily as needed for refractory nausea / vomiting. Start on day 3 after chemo. (Patient not taking: Reported on 07/15/2016), Disp: 30 tablet, Rfl: 1 .  prochlorperazine (COMPAZINE) 10 MG tablet, Take 1 tablet (10 mg total) by mouth every 6 (six) hours as needed (Nausea or vomiting). (Patient not taking: Reported on 07/15/2016), Disp: 30 tablet, Rfl: 1  Allergies:  Allergies  Allergen Reactions  . Clindamycin/Lincomycin Swelling    THROAT SWELLS  . Eggs Or Egg-Derived Products Swelling and Other (See Comments)    ABDOMINAL PAIN SWELLING, SWELLING REACTION UNSPECIFIED    . Ciprofloxacin Rash  . Codeine Hives and Swelling    SWELLING REACTION UNSPECIFIED   . Nickel Other (See Comments)    BLISTERS   .  Penicillins Hives, Nausea And Vomiting, Swelling and Rash    SWELLING REACTION UNSPECIFIED  Has patient had PCN reaction causing immediate rash, facial/tongue/throat swelling, SOB or lightheadedness with hypotension: YES PCN reaction causing severe rash involving mucus membranes or skin necrosis: NO PCN reaction that required hospitalization NO PCN reaction occurring within the last 10 years: NO If all of the above answers are "NO", then may proceed with  Cephalosporin use.  . Sulfa Antibiotics Hives and Swelling    SWELLING REACTION UNSPECIFIED   . Eszopiclone Palpitations    Past Medical History, Surgical history, Social history, and Family History were reviewed and updated.  Review of Systems:  As above  Physical Exam:  weight is 149 lb 6.4 oz (67.8 kg). Her oral temperature is 98.3 F (36.8 C). Her blood pressure is 129/82 and her pulse is 98. Her respiration is 20.   Wt Readings from Last 3 Encounters:  07/15/16 149 lb 6.4 oz (67.8 kg)  07/13/16 149 lb 6.4 oz (67.8 kg)  06/24/16 149 lb (67.6 kg)      Well-developed well-nourished white female in no obvious distress. Head and neck exam shows no ocular or oral lesions. She has no palpable cervical or supraclavicular lymph nodes. Lungs are clear bilaterally. Cardiac exam regular rate and rhythm with no murmurs, rubs or bruits. Breast exam shows right breast with no masses, edema or erythema. There is no right axillary adenopathy. Left chest wall shows the nipple sparing mastectomy. She has an implant in place. She has no erythema about the implant. She has no palpable nodules. There is no palpable left axillary adenopathy. Abdomen soft. She has good bowel sounds. There is no fluid wave. There is no palpable liver or spleen tip. Active exam shows no tenderness over the spine, ribs or hips. External shows no clubbing, cyanosis or edema. There is no lymphedema of the left arm. Skin exam shows no rashes, ecchymoses or petechia. Neurological exam shows no focal neurological deficits.  Lab Results  Component Value Date   WBC 13.3 (H) 07/15/2016   HGB 12.5 07/15/2016   HCT 37.5 07/15/2016   MCV 99 07/15/2016   PLT 350 07/15/2016     Chemistry      Component Value Date/Time   NA 142 06/24/2016 1109   NA 141 03/18/2016 1223   K 4.1 06/24/2016 1109   K 3.8 03/18/2016 1223   CL 106 06/24/2016 1109   CO2 26 06/24/2016 1109   CO2 23 03/18/2016 1223   BUN 9 06/24/2016 1109   BUN 6.9  (L) 03/18/2016 1223   CREATININE 0.7 06/24/2016 1109   CREATININE 0.8 03/18/2016 1223   GLU 82 05/07/2015      Component Value Date/Time   CALCIUM 10.2 06/24/2016 1109   CALCIUM 10.9 (H) 03/18/2016 1223   ALKPHOS 63 06/24/2016 1109   ALKPHOS 62 03/18/2016 1223   AST 22 06/24/2016 1109   AST 20 03/18/2016 1223   ALT 16 06/24/2016 1109   ALT 15 03/18/2016 1223   BILITOT 0.40 06/24/2016 1109   BILITOT 0.60 03/18/2016 1223         Impression and Plan: Ms. Saulsbury is A 60 year old postmenopausal white female with a stage IIb invasive lobular carcinoma the left breast. Her tumor is ER positive.She had one positive lymph node. There is no extranodal extension. She does have the high Oncotype score (26), which is why we are using chemotherapy.  We will go ahead with cycle #4 of Taxotere/Cytoxan. She really has done well  with treatment.  She will not take radiation therapy. She has seen Dr. Sondra Come of radiation oncology. She just does not feel that radiation would benefit her. She is worried about the long-term side effects of radiation. She says that radiation damaged her mother's heart. I told her that radiation now is different than it was back when her mother had radiation.  She will not do any more surgery with axillary dissection. Again she just does not want to have the risk of lymphedema. She is really thought hard about her decisions and her choices. She is very comfortable with not having to do radiation or surgery.   She clearly will need antiestrogen therapy. She is postmenopausal. We will consider her for Femara. I think this would be very reasonable.  Given that she is postmenopausal, we might want to think about Prolia. This might be helpful for her. I will talk to her about this when I see her back.  I talked to her and her husband for about 30 minutes. She has really thought hard about her choices and again, is very confident and comfortable not having to do any other  treatment. She said that she would rather take more chemotherapy then having to take radiation.  I respect her decisions. I will support her. We will continue to follow her closely.  [name], MD 12/13/20171:25 PM

## 2016-07-17 ENCOUNTER — Ambulatory Visit: Payer: BLUE CROSS/BLUE SHIELD

## 2016-08-05 ENCOUNTER — Other Ambulatory Visit: Payer: BLUE CROSS/BLUE SHIELD

## 2016-08-05 ENCOUNTER — Ambulatory Visit: Payer: BLUE CROSS/BLUE SHIELD

## 2016-08-05 ENCOUNTER — Ambulatory Visit: Payer: BLUE CROSS/BLUE SHIELD | Admitting: Hematology & Oncology

## 2016-08-13 NOTE — H&P (Signed)
Subjective:     Patient ID: Laura Landry is a 61 y.o. female.  HPI  Here for follow up discussion prior to planned second stage reconstruction.    Presented following screening MMG with mass noted left breast LIQ. [place] confirmed a 2.0 cm irregular mass in the left breast LIQ with left axilla sonographically benign. Biopsy found an ILC, ER+/PR- Her2 -. MRI with 2.6 x 1.8 x 2.7 cm spiculated enhancing mass abutting muscle and rib at  6-7 o'clock. Final pathology 2.5 cm ILC with 0.2 mm focally positive posterior margin and small focus ILC within 1 mm of anterior margin, SLN with metastatic lobular ca.  Dr. Donne Hazel recommended no further surgery as posterior margin muscle and anterior margin skin (skin also resected in area of tethering at time of surgery).  Oncotype 26/17% . Genetic consult rescheduled and patient canceled. Tamoxifen started prior to surgery and held during adjuvant chemotherapy, which completed 12.13.17. She has met with Drs. Colquitt and declines adjuvant radiation and ALND.  Prior 34 A/B cup, desires larger. Left mastectomy 188 g  Review of Systems     Objective:   Physical Exam  Cardiovascular: Normal rate, regular rhythm and normal heart sounds.   Pulmonary/Chest: Effort normal and breath sounds normal.  Lymphadenopathy:    She has no axillary adenopathy.    Chest:  soft, scar maturing, left breast scar with some fullness medially SN to nipple R 22 L 22 cm BW R 14 L 14 cm Nipple to IMF R 6  L 5.5.  Assessment:     Left breast ILC  Family history breast ca S/p NSM left, TE reconstruction    Plan:     Plan removal left TE and placement implant, right augmentation for symmetry. Patient has elected for saline implants, plan smooth round. Discussed risks including but not limited to rupture, rippling, need for additional surgery, capsular contracture, asymmetry, unacceptable cosmetic result, damage to deeper structures, DVT/VE, seroma,  hematoma, wound healing problems, infection, loss nipple or diminished sensation nipple. Reviewed examples saline vs silicone and filled to capacity silicone implants today. Discussed IMF scar on right and will utilize same scar on left.  Plan OP surgery. Rx for oxycodone and doxycycline given.   Natrelle 133 MV-12-T 300 ml tissue expander placed,  fill volume 300 ml   Irene Limbo, MD [facility] Plastic & Reconstructive Surgery [phone], pin [phone]

## 2016-08-20 ENCOUNTER — Encounter (HOSPITAL_BASED_OUTPATIENT_CLINIC_OR_DEPARTMENT_OTHER): Payer: Self-pay | Admitting: *Deleted

## 2016-08-21 NOTE — Progress Notes (Signed)
Boost drink given with instructions to complete by 0415, pt verbalized understanding.

## 2016-08-25 ENCOUNTER — Ambulatory Visit (HOSPITAL_BASED_OUTPATIENT_CLINIC_OR_DEPARTMENT_OTHER): Payer: BLUE CROSS/BLUE SHIELD | Admitting: Anesthesiology

## 2016-08-25 ENCOUNTER — Encounter (HOSPITAL_BASED_OUTPATIENT_CLINIC_OR_DEPARTMENT_OTHER): Payer: Self-pay | Admitting: *Deleted

## 2016-08-25 ENCOUNTER — Encounter (HOSPITAL_BASED_OUTPATIENT_CLINIC_OR_DEPARTMENT_OTHER)
Admission: RE | Disposition: A | Discharge status: Home / Self Care | Payer: Self-pay | Source: Ambulatory Visit | Attending: Plastic Surgery

## 2016-08-25 ENCOUNTER — Ambulatory Visit (HOSPITAL_BASED_OUTPATIENT_CLINIC_OR_DEPARTMENT_OTHER)
Admission: RE | Admit: 2016-08-25 | Discharge: 2016-08-25 | Disposition: A | Discharge status: Home / Self Care | Payer: BLUE CROSS/BLUE SHIELD | Source: Ambulatory Visit | Attending: Plastic Surgery | Admitting: Plastic Surgery

## 2016-08-25 DIAGNOSIS — Z803 Family history of malignant neoplasm of breast: Secondary | ICD-10-CM | POA: Diagnosis not present

## 2016-08-25 DIAGNOSIS — Z79899 Other long term (current) drug therapy: Secondary | ICD-10-CM | POA: Insufficient documentation

## 2016-08-25 DIAGNOSIS — Z853 Personal history of malignant neoplasm of breast: Secondary | ICD-10-CM | POA: Diagnosis not present

## 2016-08-25 DIAGNOSIS — J45909 Unspecified asthma, uncomplicated: Secondary | ICD-10-CM | POA: Insufficient documentation

## 2016-08-25 DIAGNOSIS — Z452 Encounter for adjustment and management of vascular access device: Secondary | ICD-10-CM | POA: Diagnosis not present

## 2016-08-25 DIAGNOSIS — Z9221 Personal history of antineoplastic chemotherapy: Secondary | ICD-10-CM | POA: Diagnosis not present

## 2016-08-25 DIAGNOSIS — N651 Disproportion of reconstructed breast: Secondary | ICD-10-CM | POA: Insufficient documentation

## 2016-08-25 DIAGNOSIS — E039 Hypothyroidism, unspecified: Secondary | ICD-10-CM | POA: Insufficient documentation

## 2016-08-25 DIAGNOSIS — Z9012 Acquired absence of left breast and nipple: Secondary | ICD-10-CM | POA: Diagnosis not present

## 2016-08-25 DIAGNOSIS — Z87891 Personal history of nicotine dependence: Secondary | ICD-10-CM | POA: Diagnosis not present

## 2016-08-25 HISTORY — PX: REMOVAL OF TISSUE EXPANDER AND PLACEMENT OF IMPLANT: SHX6457

## 2016-08-25 HISTORY — PX: BREAST ENHANCEMENT SURGERY: SHX7

## 2016-08-25 HISTORY — PX: PERIPHERAL VASCULAR CATHETERIZATION: SHX172C

## 2016-08-25 LAB — POCT HEMOGLOBIN-HEMACUE: Hemoglobin: 12.7 g/dL (ref 12.0–15.0)

## 2016-08-25 SURGERY — REMOVAL, TISSUE EXPANDER, BREAST, WITH IMPLANT INSERTION
Anesthesia: General | Site: Chest | Laterality: Right

## 2016-08-25 MED ORDER — LACTATED RINGERS IV SOLN
INTRAVENOUS | Status: DC
  Administered 2016-08-25 (×2): via INTRAVENOUS

## 2016-08-25 MED ORDER — PHENYLEPHRINE 40 MCG/ML (10ML) SYRINGE FOR IV PUSH (FOR BLOOD PRESSURE SUPPORT)
PREFILLED_SYRINGE | INTRAVENOUS | Status: AC
  Filled 2016-08-25: qty 10

## 2016-08-25 MED ORDER — OXYCODONE HCL 5 MG PO TABS
5.0000 mg | ORAL_TABLET | Freq: Once | ORAL | Status: DC | PRN
Start: 2016-08-25 — End: 2016-08-25

## 2016-08-25 MED ORDER — LIDOCAINE 2% (20 MG/ML) 5 ML SYRINGE
INTRAMUSCULAR | Status: AC
  Filled 2016-08-25: qty 5

## 2016-08-25 MED ORDER — SUGAMMADEX SODIUM 200 MG/2ML IV SOLN
INTRAVENOUS | Status: DC | PRN
  Administered 2016-08-25: 200 mg via INTRAVENOUS

## 2016-08-25 MED ORDER — PROMETHAZINE HCL 25 MG/ML IJ SOLN: 6.2500 mg | INTRAMUSCULAR | Status: DC | PRN

## 2016-08-25 MED ORDER — HYDROMORPHONE HCL 1 MG/ML IJ SOLN
0.2500 mg | INTRAMUSCULAR | Status: DC | PRN
  Administered 2016-08-25: 0.5 mg via INTRAVENOUS

## 2016-08-25 MED ORDER — GABAPENTIN 300 MG PO CAPS
ORAL_CAPSULE | ORAL | Status: AC
  Filled 2016-08-25: qty 1

## 2016-08-25 MED ORDER — SUFENTANIL CITRATE 50 MCG/ML IV SOLN
INTRAVENOUS | Status: AC
  Filled 2016-08-25: qty 1

## 2016-08-25 MED ORDER — LACTATED RINGERS IV SOLN: INTRAVENOUS | Status: DC

## 2016-08-25 MED ORDER — ACETAMINOPHEN 500 MG PO TABS
1000.0000 mg | ORAL_TABLET | ORAL | Status: AC
  Administered 2016-08-25: 1000 mg via ORAL

## 2016-08-25 MED ORDER — HYDROMORPHONE HCL 1 MG/ML IJ SOLN
INTRAMUSCULAR | Status: AC
  Filled 2016-08-25: qty 1

## 2016-08-25 MED ORDER — VANCOMYCIN HCL IN DEXTROSE 1-5 GM/200ML-% IV SOLN
1000.0000 mg | INTRAVENOUS | Status: AC
  Administered 2016-08-25: 1000 mg via INTRAVENOUS

## 2016-08-25 MED ORDER — ROCURONIUM BROMIDE 10 MG/ML (PF) SYRINGE
PREFILLED_SYRINGE | INTRAVENOUS | Status: AC
  Filled 2016-08-25: qty 10

## 2016-08-25 MED ORDER — GABAPENTIN 300 MG PO CAPS
300.0000 mg | ORAL_CAPSULE | ORAL | Status: AC
  Administered 2016-08-25: 300 mg via ORAL

## 2016-08-25 MED ORDER — DEXAMETHASONE SODIUM PHOSPHATE 10 MG/ML IJ SOLN
INTRAMUSCULAR | Status: AC
  Filled 2016-08-25: qty 1

## 2016-08-25 MED ORDER — DEXAMETHASONE SODIUM PHOSPHATE 4 MG/ML IJ SOLN
INTRAMUSCULAR | Status: DC | PRN
  Administered 2016-08-25: 10 mg via INTRAVENOUS

## 2016-08-25 MED ORDER — OXYCODONE HCL 5 MG/5ML PO SOLN: 5.0000 mg | Freq: Once | ORAL | Status: DC | PRN

## 2016-08-25 MED ORDER — ONDANSETRON HCL 4 MG/2ML IJ SOLN
INTRAMUSCULAR | Status: DC | PRN
Start: 2016-08-25 — End: 2016-08-25
  Administered 2016-08-25: 4 mg via INTRAVENOUS

## 2016-08-25 MED ORDER — EPHEDRINE 5 MG/ML INJ
INTRAVENOUS | Status: AC
  Filled 2016-08-25: qty 10

## 2016-08-25 MED ORDER — ROCURONIUM BROMIDE 100 MG/10ML IV SOLN
INTRAVENOUS | Status: DC | PRN
  Administered 2016-08-25: 50 mg via INTRAVENOUS
  Administered 2016-08-25: 10 mg via INTRAVENOUS

## 2016-08-25 MED ORDER — ONDANSETRON HCL 4 MG/2ML IJ SOLN
INTRAMUSCULAR | Status: AC
  Filled 2016-08-25: qty 2

## 2016-08-25 MED ORDER — SUCCINYLCHOLINE CHLORIDE 200 MG/10ML IV SOSY
PREFILLED_SYRINGE | INTRAVENOUS | Status: AC
  Filled 2016-08-25: qty 10

## 2016-08-25 MED ORDER — CHLORHEXIDINE GLUCONATE CLOTH 2 % EX PADS: 6.0000 | MEDICATED_PAD | Freq: Once | CUTANEOUS | Status: DC

## 2016-08-25 MED ORDER — ARTIFICIAL TEARS OP OINT
TOPICAL_OINTMENT | OPHTHALMIC | Status: AC
  Filled 2016-08-25: qty 3.5

## 2016-08-25 MED ORDER — SODIUM CHLORIDE 0.9 % IR SOLN
Status: DC | PRN
  Administered 2016-08-25: 1000 mL

## 2016-08-25 MED ORDER — CELECOXIB 200 MG PO CAPS
ORAL_CAPSULE | ORAL | Status: AC
  Filled 2016-08-25: qty 2

## 2016-08-25 MED ORDER — MIDAZOLAM HCL 2 MG/2ML IJ SOLN
1.0000 mg | INTRAMUSCULAR | Status: DC | PRN
  Administered 2016-08-25: 2 mg via INTRAVENOUS

## 2016-08-25 MED ORDER — MIDAZOLAM HCL 2 MG/2ML IJ SOLN
INTRAMUSCULAR | Status: AC
  Filled 2016-08-25: qty 2

## 2016-08-25 MED ORDER — POVIDONE-IODINE 10 % EX SOLN
CUTANEOUS | Status: DC | PRN
  Administered 2016-08-25: 1 via TOPICAL

## 2016-08-25 MED ORDER — SCOPOLAMINE 1 MG/3DAYS TD PT72: 1.0000 | MEDICATED_PATCH | Freq: Once | TRANSDERMAL | Status: DC | PRN

## 2016-08-25 MED ORDER — VANCOMYCIN HCL IN DEXTROSE 1-5 GM/200ML-% IV SOLN
INTRAVENOUS | Status: AC
  Filled 2016-08-25: qty 200

## 2016-08-25 MED ORDER — PROPOFOL 500 MG/50ML IV EMUL
INTRAVENOUS | Status: AC
  Filled 2016-08-25: qty 50

## 2016-08-25 MED ORDER — SUFENTANIL CITRATE 50 MCG/ML IV SOLN
INTRAVENOUS | Status: DC | PRN
  Administered 2016-08-25: 5 ug via INTRAVENOUS
  Administered 2016-08-25: 15 ug via INTRAVENOUS

## 2016-08-25 MED ORDER — MEPERIDINE HCL 25 MG/ML IJ SOLN: 6.2500 mg | INTRAMUSCULAR | Status: DC | PRN

## 2016-08-25 MED ORDER — PHENYLEPHRINE HCL 10 MG/ML IJ SOLN
INTRAMUSCULAR | Status: DC | PRN
  Administered 2016-08-25: 80 ug via INTRAVENOUS

## 2016-08-25 MED ORDER — ACETAMINOPHEN 500 MG PO TABS
ORAL_TABLET | ORAL | Status: AC
  Filled 2016-08-25: qty 2

## 2016-08-25 MED ORDER — PROPOFOL 10 MG/ML IV BOLUS
INTRAVENOUS | Status: DC | PRN
  Administered 2016-08-25: 150 mg via INTRAVENOUS

## 2016-08-25 MED ORDER — FENTANYL CITRATE (PF) 100 MCG/2ML IJ SOLN: 50.0000 ug | INTRAMUSCULAR | Status: DC | PRN

## 2016-08-25 MED ORDER — CELECOXIB 400 MG PO CAPS
400.0000 mg | ORAL_CAPSULE | ORAL | Status: AC
  Administered 2016-08-25: 400 mg via ORAL

## 2016-08-25 MED ORDER — SUGAMMADEX SODIUM 500 MG/5ML IV SOLN
INTRAVENOUS | Status: AC
  Filled 2016-08-25: qty 5

## 2016-08-25 MED ORDER — LIDOCAINE HCL (CARDIAC) 20 MG/ML IV SOLN
INTRAVENOUS | Status: DC | PRN
  Administered 2016-08-25: 50 mg via INTRAVENOUS

## 2016-08-25 SURGICAL SUPPLY — 81 items
BAG DECANTER FOR FLEXI CONT (MISCELLANEOUS) ×5 IMPLANT
BANDAGE ACE 6X5 VEL STRL LF (GAUZE/BANDAGES/DRESSINGS) IMPLANT
BINDER BREAST LRG (GAUZE/BANDAGES/DRESSINGS) IMPLANT
BINDER BREAST MEDIUM (GAUZE/BANDAGES/DRESSINGS) IMPLANT
BINDER BREAST XLRG (GAUZE/BANDAGES/DRESSINGS) IMPLANT
BINDER BREAST XXLRG (GAUZE/BANDAGES/DRESSINGS) IMPLANT
BLADE SURG 10 STRL SS (BLADE) ×5 IMPLANT
BLADE SURG 15 STRL LF DISP TIS (BLADE) ×3 IMPLANT
BLADE SURG 15 STRL SS (BLADE) ×10
BNDG GAUZE ELAST 4 BULKY (GAUZE/BANDAGES/DRESSINGS) ×10 IMPLANT
CANISTER SUCT 1200ML W/VALVE (MISCELLANEOUS) ×5 IMPLANT
CHLORAPREP W/TINT 26ML (MISCELLANEOUS) ×5 IMPLANT
CLOSURE WOUND 1/2 X4 (GAUZE/BANDAGES/DRESSINGS)
COVER BACK TABLE 60X90IN (DRAPES) ×5 IMPLANT
COVER MAYO STAND STRL (DRAPES) ×5 IMPLANT
DECANTER SPIKE VIAL GLASS SM (MISCELLANEOUS) ×5 IMPLANT
DRAIN CHANNEL 15F RND FF W/TCR (WOUND CARE) IMPLANT
DRAPE TOP ARMCOVERS (MISCELLANEOUS) ×4 IMPLANT
DRAPE U-SHAPE 76X120 STRL (DRAPES) ×5 IMPLANT
DRSG PAD ABDOMINAL 8X10 ST (GAUZE/BANDAGES/DRESSINGS) ×10 IMPLANT
DRSG TEGADERM 2-3/8X2-3/4 SM (GAUZE/BANDAGES/DRESSINGS) IMPLANT
ELECT BLADE 4.0 EZ CLEAN MEGAD (MISCELLANEOUS) ×5
ELECT BLADE 6.5 .24CM SHAFT (ELECTRODE) IMPLANT
ELECT COATED BLADE 2.86 ST (ELECTRODE) ×5 IMPLANT
ELECT REM PT RETURN 9FT ADLT (ELECTROSURGICAL) ×5
ELECTRODE BLDE 4.0 EZ CLN MEGD (MISCELLANEOUS) ×3 IMPLANT
ELECTRODE REM PT RTRN 9FT ADLT (ELECTROSURGICAL) ×3 IMPLANT
EVACUATOR SILICONE 100CC (DRAIN) IMPLANT
GLOVE BIO SURGEON STRL SZ 6 (GLOVE) ×10 IMPLANT
GLOVE BIOGEL PI IND STRL 7.0 (GLOVE) IMPLANT
GLOVE BIOGEL PI INDICATOR 7.0 (GLOVE) ×2
GLOVE ECLIPSE 6.5 STRL STRAW (GLOVE) ×6 IMPLANT
GOWN STRL REUS W/ TWL LRG LVL3 (GOWN DISPOSABLE) ×6 IMPLANT
GOWN STRL REUS W/TWL LRG LVL3 (GOWN DISPOSABLE) ×10
IMPL BREAST P2.8-3.3XLO 175 (Breast) IMPLANT
IMPL BREAST SALINE 330CC (Breast) IMPLANT
IMPL BRST P2.8-3.3XLO 175CC (Breast) ×3 IMPLANT
IMPLANT BREAST SALINE 175CC (Breast) ×4 IMPLANT
IMPLANT BREAST SALINE 330CC (Breast) ×5 IMPLANT
IV NS 1000ML (IV SOLUTION) ×5
IV NS 1000ML BAXH (IV SOLUTION) IMPLANT
IV NS 500ML (IV SOLUTION)
IV NS 500ML BAXH (IV SOLUTION) ×3 IMPLANT
KIT FILL SYSTEM UNIVERSAL (SET/KITS/TRAYS/PACK) IMPLANT
LIQUID BAND (GAUZE/BANDAGES/DRESSINGS) ×10 IMPLANT
NDL HYPO 25X1 1.5 SAFETY (NEEDLE) ×3 IMPLANT
NEEDLE HYPO 25X1 1.5 SAFETY (NEEDLE) IMPLANT
NS IRRIG 1000ML POUR BTL (IV SOLUTION) ×5 IMPLANT
PACK BASIN DAY SURGERY FS (CUSTOM PROCEDURE TRAY) ×5 IMPLANT
PACK UNIVERSAL I (CUSTOM PROCEDURE TRAY) IMPLANT
PENCIL BUTTON HOLSTER BLD 10FT (ELECTRODE) ×5 IMPLANT
PIN SAFETY STERILE (MISCELLANEOUS) IMPLANT
SHEET MEDIUM DRAPE 40X70 STRL (DRAPES) ×10 IMPLANT
SIZER BREAST SGL USE 175-195CC (SIZER) ×4
SIZER BREAST SGL USE 330-360CC (SIZER) ×5
SIZER BREAST SGL USE 360CC (SIZER) ×5
SIZER BREAST SGL USE LP 150CC (SIZER) ×5
SIZER BRST SGL USE 175-195CC (SIZER) IMPLANT
SIZER BRST SGL USE 330-360CC (SIZER) IMPLANT
SIZER BRST SGL USE 360CC (SIZER) IMPLANT
SIZER BRST SGL USE LP 150CC (SIZER) IMPLANT
SLEEVE SCD COMPRESS KNEE MED (MISCELLANEOUS) ×5 IMPLANT
SPONGE LAP 18X18 X RAY DECT (DISPOSABLE) ×12 IMPLANT
STAPLER VISISTAT 35W (STAPLE) ×5 IMPLANT
STRIP CLOSURE SKIN 1/2X4 (GAUZE/BANDAGES/DRESSINGS) ×3 IMPLANT
SUT ETHILON 2 0 FS 18 (SUTURE) IMPLANT
SUT MNCRL AB 4-0 PS2 18 (SUTURE) ×7 IMPLANT
SUT PDS AB 2-0 CT2 27 (SUTURE) IMPLANT
SUT VIC AB 3-0 PS1 18 (SUTURE)
SUT VIC AB 3-0 PS1 18XBRD (SUTURE) IMPLANT
SUT VIC AB 3-0 SH 27 (SUTURE) ×15
SUT VIC AB 3-0 SH 27X BRD (SUTURE) ×3 IMPLANT
SUT VICRYL 4-0 PS2 18IN ABS (SUTURE) ×7 IMPLANT
SYR 50ML LL SCALE MARK (SYRINGE) ×5 IMPLANT
SYR BULB IRRIGATION 50ML (SYRINGE) ×10 IMPLANT
SYR CONTROL 10ML LL (SYRINGE) ×3 IMPLANT
TOWEL OR 17X24 6PK STRL BLUE (TOWEL DISPOSABLE) ×10 IMPLANT
TUBE CONNECTING 20'X1/4 (TUBING) ×2
TUBE CONNECTING 20X1/4 (TUBING) ×8 IMPLANT
UNDERPAD 30X30 (UNDERPADS AND DIAPERS) ×10 IMPLANT
YANKAUER SUCT BULB TIP NO VENT (SUCTIONS) ×5 IMPLANT

## 2016-08-25 NOTE — Transfer of Care (Signed)
Immediate Anesthesia Transfer of Care Note  Patient: Laura Landry  Procedure(s) Performed: Procedure(s): REMOVAL OF LEFT TISSUE EXPANDER AND PLACEMENT OF SALINE IMPLANT (Left) RIGHT BREAST AUGMENTATION FOR SYMMETRY (Right) PORTA CATH REMOVAL (Right)  Patient Location: PACU  Anesthesia Type:General  Level of Consciousness: awake, alert  and oriented  Airway & Oxygen Therapy: Patient Spontanous Breathing and Patient connected to face mask oxygen  Post-op Assessment: Report given to RN and Post -op Vital signs reviewed and stable  Post vital signs: Reviewed and stable  Last Vitals:  Vitals:   08/25/16 0943 08/25/16 0944  BP:    Pulse: 88 88  Resp:  10  Temp:  36.9 C    Last Pain:  Vitals:   08/25/16 0643  TempSrc: Oral         Complications: No apparent anesthesia complications

## 2016-08-25 NOTE — Op Note (Signed)
Operative Note   DATE OF OPERATION: 1.23.18  LOCATION: [facility]-outpatient  SURGICAL DIVISION: Plastic Surgery  PREOPERATIVE DIAGNOSES:  1. History left breast cancer 2. Acquired absence left breast 3. Asymmetry native and reconstructed breast  POSTOPERATIVE DIAGNOSES:  same  PROCEDURE:  1. Removal right chest port 2. Removal left chest tissue expander and placement saline implant 3. Right breast augmentation for symmetry  SURGEON: Irene Limbo MD MBA  ASSISTANT: none  ANESTHESIA:  General.   EBL: 30 ml  COMPLICATIONS: None immediate.   INDICATIONS FOR PROCEDURE:  The patient, Laura Landry, is a 61 y.o. female born on January 10, 1956, is here for second stage breast reconstruction following left nipple sparing mastectomy and immediate expander based reconstruction.   FINDINGS: Natrelle Smooth Round Saline implants placed. LEFT Moderate Profile 330 ml, filled to 360 ml. REF [id number] SN [id number] RIGHT Low Profile 175 ml, filled to 190 ml. REF [id number] SN [id number]  DESCRIPTION OF PROCEDURE:  The patient's operative site was marked with the patient in the preoperative area to mark chest midline, sternal notch, anterior axillary lines. The patient was taken to the operating room. SCDs were placed and IV antibiotics were given. The patient's operative site was prepped and draped in a sterile fashion. A time out was performed and all information was confirmed to be correct. Incision made in right chest over port scar and carried through superficial fascia to port. Sutures removed from port and this was removed intact. Closure completed with 4-0 vicryl in superficial fascia and dermis followed by 4-0 monocryl subcuticular.  Incision made in lateral left mastectomy scar and carried to muscle layer. Pectoralis muscle divided in direction of fibers. Tissue expander removed. Capsulotomies performed medially and superiorly and radial scoring of lower pole capsule completed. Sizer placed and  incision tailor tacked closed.   I then directed attention to right breast. Incision made over chest wall at desired IMF 8 cm from nipple and carried through subcutaneous tissue and superficial fascia. Dual plane two dissection completed with inferior insertion pectoralis muscle divided and breast parenchyma elevated off muscle fascia to level of 3-4 cm inferior to nipple areola complex. Sizer placed and patient brought to upright sitting position. A Natrelle Moderate profile 330 ml implant selected for left chest and Natrelle Low profile 175 ml implant selected for right breast.   The patient was returned to supine position. Cavities irrigated with solution containing bacitracin and polymyxin. Hemostasis ensured. Cavities irrigated with Betadine. Over left chest implant placed and filled to 360 ml. Fill tubing removed and tab closure, proper orientation maintained. Closure completed with 3-0 vicryl to close muscle layer, 4-0 vicryl in dermis, and running 4-0 monocryl skin closure. Patient demonstrated small redundancy of medial extent left mastectomy scar and this was de epithelialized and closed in simple 4-0 monocryl stitch. Over right breast, implant placed and filled to 190 ml. Superficial fascia closed over implant at [facility] with 3-0 vicryl followed by 4-0 vicryl in dermis. Skin closure completed with 4-0 monocryl throughout. Tissue adhesive applied followed by dry dressing and compression bra.   The patient was allowed to wake from anesthesia, extubated and taken to the recovery room in satisfactory condition.   SPECIMENS: none  DRAINS: none  Irene Limbo, MD [facility] Plastic & Reconstructive Surgery [phone], pin [phone]

## 2016-08-25 NOTE — Anesthesia Preprocedure Evaluation (Addendum)
Anesthesia Evaluation  Patient identified by MRN, date of birth, ID band Patient awake    Reviewed: Allergy & Precautions, NPO status , Patient's Chart, lab work & pertinent test results  History of Anesthesia Complications (+) PONV and history of anesthetic complications  Airway Mallampati: II  TM Distance: >3 FB Neck ROM: Full    Dental  (+) Teeth Intact, Dental Advisory Given   Pulmonary asthma , former smoker,    breath sounds clear to auscultation       Cardiovascular negative cardio ROS   Rhythm:Regular Rate:Normal     Neuro/Psych PSYCHIATRIC DISORDERS Anxiety negative neurological ROS     GI/Hepatic negative GI ROS, Neg liver ROS,   Endo/Other  Hypothyroidism   Renal/GU negative Renal ROS  negative genitourinary   Musculoskeletal negative musculoskeletal ROS (+)   Abdominal   Peds negative pediatric ROS (+)  Hematology   Anesthesia Other Findings   Reproductive/Obstetrics negative OB ROS                            Lab Results  Component Value Date   WBC 13.3 (H) 07/15/2016   HGB 12.5 07/15/2016   HCT 37.5 07/15/2016   MCV 99 07/15/2016   PLT 350 07/15/2016   Lab Results  Component Value Date   CREATININE 0.8 07/15/2016   BUN 8 07/15/2016   NA 140 07/15/2016   K 3.7 07/15/2016   CL 105 07/15/2016   CO2 27 07/15/2016   No results found for: INR, PROTIME  Anesthesia Physical Anesthesia Plan  ASA: II  Anesthesia Plan: General   Post-op Pain Management:    Induction: Intravenous  Airway Management Planned: Oral ETT  Additional Equipment:   Intra-op Plan:   Post-operative Plan: Extubation in OR  Informed Consent: I have reviewed the patients History and Physical, chart, labs and discussed the procedure including the risks, benefits and alternatives for the proposed anesthesia with the patient or authorized representative who has indicated his/her  understanding and acceptance.   Dental advisory given  Plan Discussed with: CRNA  Anesthesia Plan Comments:         Anesthesia Quick Evaluation

## 2016-08-25 NOTE — Anesthesia Postprocedure Evaluation (Addendum)
Anesthesia Post Note  Patient: Laura Landry  Procedure(s) Performed: Procedure(s) (LRB): REMOVAL OF LEFT TISSUE EXPANDER AND PLACEMENT OF SALINE IMPLANT (Left) RIGHT BREAST AUGMENTATION FOR SYMMETRY (Right) PORTA CATH REMOVAL (Right)  Patient location during evaluation: PACU Anesthesia Type: General Level of consciousness: awake and alert Pain management: pain level controlled Vital Signs Assessment: post-procedure vital signs reviewed and stable Respiratory status: spontaneous breathing, nonlabored ventilation, respiratory function stable and patient connected to nasal cannula oxygen Cardiovascular status: blood pressure returned to baseline and stable Postop Assessment: no signs of nausea or vomiting Anesthetic complications: no       Last Vitals:  Vitals:   08/25/16 1037 08/25/16 1100  BP:  (!) 127/93  Pulse: 83 74  Resp: 14 16  Temp:  36.4 C    Last Pain:  Vitals:   08/25/16 1100  TempSrc:   PainSc: 2                  Effie Berkshire

## 2016-08-25 NOTE — Interval H&P Note (Signed)
History and Physical Interval Note:  08/25/2016 6:53 AM  Laura Landry  has presented today for surgery, with the diagnosis of history of breadt cancer, acquired absence of left breast  The various methods of treatment have been discussed with the patient and family. After consideration of risks, benefits and other options for treatment, the patient has consented to  Procedure(s): REMOVAL OF LEFT TISSUE EXPANDER AND PLACEMENT OF SALINE IMPLANT (Left) RIGHT BREAST AUGMENTATION FOR SYMMETRY (Right) as a surgical intervention .  The patient's history has been reviewed, patient examined, no change in status, stable for surgery.  I have reviewed the patient's chart and labs.  Questions were answered to the patient's satisfaction.     Mariluz Crespo

## 2016-08-25 NOTE — Discharge Instructions (Signed)

## 2016-08-25 NOTE — Anesthesia Procedure Notes (Signed)
Procedure Name: Intubation Date/Time: 08/25/2016 7:38 AM Performed by: Melynda Ripple D Pre-anesthesia Checklist: Patient identified, Emergency Drugs available, Suction available and Patient being monitored Patient Re-evaluated:Patient Re-evaluated prior to inductionOxygen Delivery Method: Circle system utilized Preoxygenation: Pre-oxygenation with 100% oxygen Intubation Type: IV induction Ventilation: Mask ventilation without difficulty Laryngoscope Size: Mac and 3 Grade View: Grade I Tube type: Oral Tube size: 7.0 mm Number of attempts: 1 Airway Equipment and Method: Stylet and Oral airway Placement Confirmation: ETT inserted through vocal cords under direct vision,  positive ETCO2 and breath sounds checked- equal and bilateral Secured at: 22 cm Tube secured with: Tape Dental Injury: Teeth and Oropharynx as per pre-operative assessment

## 2016-08-26 ENCOUNTER — Ambulatory Visit: Payer: BLUE CROSS/BLUE SHIELD | Admitting: Hematology & Oncology

## 2016-08-26 ENCOUNTER — Other Ambulatory Visit: Payer: BLUE CROSS/BLUE SHIELD

## 2016-08-26 ENCOUNTER — Encounter (HOSPITAL_BASED_OUTPATIENT_CLINIC_OR_DEPARTMENT_OTHER): Payer: Self-pay | Admitting: Plastic Surgery

## 2016-09-21 ENCOUNTER — Telehealth: Payer: Self-pay | Admitting: Hematology & Oncology

## 2016-09-21 NOTE — Telephone Encounter (Signed)
Patient called to cancel SCP appointment. Will not reschedule, per patient. 09/21/16 los.

## 2016-10-01 ENCOUNTER — Other Ambulatory Visit (HOSPITAL_BASED_OUTPATIENT_CLINIC_OR_DEPARTMENT_OTHER): Payer: BLUE CROSS/BLUE SHIELD

## 2016-10-01 ENCOUNTER — Ambulatory Visit (HOSPITAL_BASED_OUTPATIENT_CLINIC_OR_DEPARTMENT_OTHER): Payer: BLUE CROSS/BLUE SHIELD | Admitting: Hematology & Oncology

## 2016-10-01 VITALS — BP 140/88 | HR 100 | Temp 98.0°F | Resp 16 | Wt 153.0 lb

## 2016-10-01 DIAGNOSIS — C50912 Malignant neoplasm of unspecified site of left female breast: Secondary | ICD-10-CM

## 2016-10-01 DIAGNOSIS — C773 Secondary and unspecified malignant neoplasm of axilla and upper limb lymph nodes: Secondary | ICD-10-CM | POA: Diagnosis not present

## 2016-10-01 DIAGNOSIS — C50312 Malignant neoplasm of lower-inner quadrant of left female breast: Secondary | ICD-10-CM

## 2016-10-01 DIAGNOSIS — Z170 Estrogen receptor positive status [ER+]: Principal | ICD-10-CM

## 2016-10-01 DIAGNOSIS — E559 Vitamin D deficiency, unspecified: Secondary | ICD-10-CM

## 2016-10-01 DIAGNOSIS — R609 Edema, unspecified: Secondary | ICD-10-CM

## 2016-10-01 LAB — CBC WITH DIFFERENTIAL (CANCER CENTER ONLY)
BASO#: 0 10*3/uL (ref 0.0–0.2)
BASO%: 0.6 % (ref 0.0–2.0)
EOS%: 6.9 % (ref 0.0–7.0)
Eosinophils Absolute: 0.5 10*3/uL (ref 0.0–0.5)
HCT: 42.3 % (ref 34.8–46.6)
HEMOGLOBIN: 14 g/dL (ref 11.6–15.9)
LYMPH#: 2 10*3/uL (ref 0.9–3.3)
LYMPH%: 28.3 % (ref 14.0–48.0)
MCH: 32.3 pg (ref 26.0–34.0)
MCHC: 33.1 g/dL (ref 32.0–36.0)
MCV: 98 fL (ref 81–101)
MONO#: 0.6 10*3/uL (ref 0.1–0.9)
MONO%: 8.5 % (ref 0.0–13.0)
NEUT%: 55.7 % (ref 39.6–80.0)
NEUTROS ABS: 3.9 10*3/uL (ref 1.5–6.5)
Platelets: 260 10*3/uL (ref 145–400)
RBC: 4.33 10*6/uL (ref 3.70–5.32)
RDW: 15 % (ref 11.1–15.7)
WBC: 7 10*3/uL (ref 3.9–10.0)

## 2016-10-01 LAB — COMPREHENSIVE METABOLIC PANEL
ALT: 9 U/L (ref 0–55)
AST: 15 U/L (ref 5–34)
Albumin: 3.9 g/dL (ref 3.5–5.0)
Alkaline Phosphatase: 66 U/L (ref 40–150)
Anion Gap: 8 mEq/L (ref 3–11)
BUN: 5.9 mg/dL — ABNORMAL LOW (ref 7.0–26.0)
CHLORIDE: 108 meq/L (ref 98–109)
CO2: 25 meq/L (ref 22–29)
Calcium: 10.1 mg/dL (ref 8.4–10.4)
Creatinine: 0.7 mg/dL (ref 0.6–1.1)
EGFR: >90 mL/min/{1.73_m2} (ref >90)
GLUCOSE: 126 mg/dL (ref 70–140)
POTASSIUM: 3.7 meq/L (ref 3.5–5.1)
SODIUM: 141 meq/L (ref 136–145)
TOTAL PROTEIN: 6.4 g/dL (ref 6.4–8.3)
Total Bilirubin: 0.39 mg/dL (ref 0.20–1.20)

## 2016-10-01 LAB — LACTATE DEHYDROGENASE: LDH: 150 U/L (ref 125–245)

## 2016-10-01 MED ORDER — CLARITHROMYCIN 500 MG PO TABS: ORAL_TABLET | ORAL | 0 refills | Status: DC

## 2016-10-01 MED ORDER — HYDROCHLOROTHIAZIDE 25 MG PO TABS: 25.0000 mg | ORAL_TABLET | Freq: Every day | ORAL | 4 refills | Status: DC

## 2016-10-01 NOTE — Progress Notes (Signed)
Hematology and Oncology Follow Up Visit  Laura Landry [id number] 08-18-1955 61 y.o. 10/01/2016   Principle Diagnosis:   Stage IIB (T2N1M0) invasive lobular carcinoma of the LEFT breast- ER+/HER2-;  Oncotype score is 26  Current Therapy:    Taxotere/Cytoxan - s/p cycle #4 - completed in 07/15/2016  Status post reconstructive surgery in January 2018  Patient declined radiation therapy and aromatase inhibitor therapy     Interim History:  Laura Landry is back for follow-up. She did undergo reconstructive surgery for the left breast in late January. She got through this very nicely. She tolerated the surgery well. Looks like she has healed up quite nicely.  She really does not want to take anymore therapy. She has declined radiation therapy. She thought about taking aromatase inhibitor therapy but again, she has declined this because of the side effects that she has read about.   She is feeling well. She still is a little tired. She is complaining of some swelling in her hands and feet. I will send in a prescription for hydrochlorothiazide. I told her to take this as necessary.   She's had no fever. She has had no problems with bowels or bladder. She's had no cough. She has had no rashes outside of some small papular type lesions under her breasts bilaterally.   She's having some difficulties with eyelid infections. I told her to apply some eyelid rinse (OcuSoft) to try to help with this.   Overall, her performance status is ECOG 1.  Medications:  Current Outpatient Prescriptions:  .  albuterol (PROVENTIL HFA;VENTOLIN HFA) 108 (90 Base) MCG/ACT inhaler, Inhale 1-2 puffs into the lungs every 6 (six) hours as needed for wheezing or shortness of breath., Disp: 1 Inhaler, Rfl: 0 .  Calcium Carbonate-Vitamin D (CALCIUM-VITAMIN D) 500-200 MG-UNIT tablet, Take 2 tablets by mouth daily. , Disp: , Rfl:  .  glucosamine-chondroitin 500-400 MG tablet, Take 3 tablets by mouth daily., Disp: , Rfl:  .   levothyroxine (SYNTHROID, LEVOTHROID) 100 MCG tablet, Take 1 tablet (100 mcg total) by mouth daily., Disp: 90 tablet, Rfl: 1 .  Multiple Vitamins-Minerals (ALIVE WOMENS 50+ PO), Take 1 tablet by mouth daily. , Disp: , Rfl:  .  naphazoline-pheniramine (NAPHCON-A) 0.025-0.3 % ophthalmic solution, Place 1 drop into both eyes 4 (four) times daily as needed for irritation or allergies., Disp: , Rfl:  .  SALINE NASAL MIST NA, Place 1 spray into the nose daily as needed (nasal congestion)., Disp: , Rfl:   Allergies:  Allergies  Allergen Reactions  . Clindamycin/Lincomycin Swelling    THROAT SWELLS  . Eggs Or Egg-Derived Products Swelling and Other (See Comments)    ABDOMINAL PAIN SWELLING, SWELLING REACTION UNSPECIFIED    . Ciprofloxacin Rash  . Codeine Hives and Swelling    SWELLING REACTION UNSPECIFIED   . Nickel Other (See Comments)    BLISTERS   . Penicillins Hives, Nausea And Vomiting, Swelling and Rash    SWELLING REACTION UNSPECIFIED  Has patient had PCN reaction causing immediate rash, facial/tongue/throat swelling, SOB or lightheadedness with hypotension: YES PCN reaction causing severe rash involving mucus membranes or skin necrosis: NO PCN reaction that required hospitalization NO PCN reaction occurring within the last 10 years: NO If all of the above answers are "NO", then may proceed with Cephalosporin use.  . Sulfa Antibiotics Hives and Swelling    SWELLING REACTION UNSPECIFIED   . Eszopiclone Palpitations  . Neulasta [Pegfilgrastim] Palpitations    Stabbing back pain, sob    Past  Medical History, Surgical history, Social history, and Family History were reviewed and updated.  Review of Systems:  As above  Physical Exam:  weight is 153 lb (69.4 kg). Her oral temperature is 98 F (36.7 C). Her blood pressure is 140/88 and her pulse is 100. Her respiration is 16 and oxygen saturation is 100%.   Wt Readings from Last 3 Encounters:  10/01/16 153 lb (69.4 kg)    08/25/16 152 lb 12.8 oz (69.3 kg)  07/15/16 149 lb 6.4 oz (67.8 kg)      Well-developed well-nourished white female in no obvious distress. Head and neck exam shows no ocular or oral lesions. She has no palpable cervical or supraclavicular lymph nodes. Lungs are clear bilaterally. Cardiac exam regular rate and rhythm with no murmurs, rubs or bruits. Breast exam shows right breast with no masses, edema or erythema. There is no right axillary adenopathy. Left chest wall shows the reconstructed breast. This is healed nicely. She has no palpable nodules. There is no palpable left axillary adenopathy. Abdomen soft. She has good bowel sounds. There is no fluid wave. There is no palpable liver or spleen tip. Active exam shows no tenderness over the spine, ribs or hips. External shows no clubbing, cyanosis or edema. There is no lymphedema of the left arm. Skin exam shows no rashes, ecchymoses or petechia. Neurological exam shows no focal neurological deficits.  Lab Results  Component Value Date   WBC 7.0 10/01/2016   HGB 14.0 10/01/2016   HCT 42.3 10/01/2016   MCV 98 10/01/2016   PLT 260 10/01/2016     Chemistry      Component Value Date/Time   NA 140 07/15/2016 1236   NA 141 03/18/2016 1223   K 3.7 07/15/2016 1236   K 3.8 03/18/2016 1223   CL 105 07/15/2016 1236   CO2 27 07/15/2016 1236   CO2 23 03/18/2016 1223   BUN 8 07/15/2016 1236   BUN 6.9 (L) 03/18/2016 1223   CREATININE 0.8 07/15/2016 1236   CREATININE 0.8 03/18/2016 1223   GLU 82 05/07/2015      Component Value Date/Time   CALCIUM 10.3 07/15/2016 1236   CALCIUM 10.9 (H) 03/18/2016 1223   ALKPHOS 59 07/15/2016 1236   ALKPHOS 62 03/18/2016 1223   AST 22 07/15/2016 1236   AST 20 03/18/2016 1223   ALT 16 07/15/2016 1236   ALT 15 03/18/2016 1223   BILITOT 0.40 07/15/2016 1236   BILITOT 0.60 03/18/2016 1223         Impression and Plan: Laura Landry is A 61 year old postmenopausal white female with a stage IIb invasive  lobular carcinoma the left breast. Her tumor is ER positive.She had one positive lymph node. There is no extranodal extension. She does have the high Oncotype score (26), which is why we are using chemotherapy.  We will go ahead with cycle #4 of Taxotere/Cytoxan. She really has done well with treatment.  She will not take radiation therapy. She has seen Dr. Sondra Come of radiation oncology. She just does not feel that radiation would benefit her. She is worried about the long-term side effects of radiation. She says that radiation damaged her mother's heart. I told her that radiation now is different than it was back when her mother had radiation.  She will not do any more surgery with axillary dissection. Again she just does not want to have the risk of lymphedema. She is really thought hard about her decisions and her choices. She is very comfortable with  not having to do radiation or surgery.   She has declined aromatase inhibitor therapy. She just reads all these things about the side effects. She does not think her body can manage anymore side effects from medications. She says that she is just very sensitive to medications. She does not want to risk having side effects from aromatase inhibitor therapy. She already has arthralgias.  She realizes and accepts the fact that without further antiestrogen therapy, that her risk of breast cancer recurrence does increase. She is willing to take that risk in order for her to have "peace of mind".   I respect her decisions. I will support her. We will continue to follow her closely.  I will plan to see her back in 3 months. I spent about 35 minutes with her today talking to her about her decision.  [name], MD 3/1/201811:38 AM

## 2016-10-02 ENCOUNTER — Telehealth: Payer: Self-pay | Admitting: *Deleted

## 2016-10-02 LAB — VITAMIN D 25 HYDROXY (VIT D DEFICIENCY, FRACTURES): VIT D 25 HYDROXY: 41.9 ng/mL (ref 30.0–100.0)

## 2016-10-02 NOTE — Telephone Encounter (Addendum)
Patient aware of results  ----- Message from [name], MD sent at 10/02/2016  6:50 AM EST ----- Call - Vit D level is great!!  pete

## 2016-10-22 ENCOUNTER — Encounter: Payer: BLUE CROSS/BLUE SHIELD | Admitting: Adult Health

## 2017-01-01 NOTE — Addendum Note (Signed)
Addendum  created 01/01/17 4862 by Effie Berkshire, MD   Sign clinical note

## 2017-01-13 ENCOUNTER — Ambulatory Visit (HOSPITAL_BASED_OUTPATIENT_CLINIC_OR_DEPARTMENT_OTHER): Payer: BLUE CROSS/BLUE SHIELD | Admitting: Hematology & Oncology

## 2017-01-13 ENCOUNTER — Other Ambulatory Visit (HOSPITAL_BASED_OUTPATIENT_CLINIC_OR_DEPARTMENT_OTHER): Payer: BLUE CROSS/BLUE SHIELD

## 2017-01-13 VITALS — BP 146/84 | HR 74 | Temp 97.5°F | Resp 18 | Wt 149.0 lb

## 2017-01-13 DIAGNOSIS — Z170 Estrogen receptor positive status [ER+]: Secondary | ICD-10-CM

## 2017-01-13 DIAGNOSIS — E559 Vitamin D deficiency, unspecified: Secondary | ICD-10-CM

## 2017-01-13 DIAGNOSIS — C773 Secondary and unspecified malignant neoplasm of axilla and upper limb lymph nodes: Secondary | ICD-10-CM | POA: Diagnosis not present

## 2017-01-13 DIAGNOSIS — R609 Edema, unspecified: Secondary | ICD-10-CM

## 2017-01-13 DIAGNOSIS — C50312 Malignant neoplasm of lower-inner quadrant of left female breast: Secondary | ICD-10-CM

## 2017-01-13 DIAGNOSIS — C50912 Malignant neoplasm of unspecified site of left female breast: Secondary | ICD-10-CM

## 2017-01-13 LAB — CMP (CANCER CENTER ONLY)
ALT(SGPT): 19 U/L (ref 10–47)
AST: 25 U/L (ref 11–38)
Albumin: 4.3 g/dL (ref 3.3–5.5)
Alkaline Phosphatase: 63 U/L (ref 26–84)
BUN: 4 mg/dL — AB (ref 7–22)
CALCIUM: 10.3 mg/dL (ref 8.0–10.3)
CHLORIDE: 110 meq/L — AB (ref 98–108)
CO2: 26 mEq/L (ref 18–33)
Creat: 0.6 mg/dl (ref 0.6–1.2)
Glucose, Bld: 95 mg/dL (ref 73–118)
POTASSIUM: 4 meq/L (ref 3.3–4.7)
Sodium: 142 mEq/L (ref 128–145)
TOTAL PROTEIN: 6.9 g/dL (ref 6.4–8.1)
Total Bilirubin: 0.8 mg/dl (ref 0.20–1.60)

## 2017-01-13 LAB — CBC WITH DIFFERENTIAL (CANCER CENTER ONLY)
BASO#: 0 10*3/uL (ref 0.0–0.2)
BASO%: 0.6 % (ref 0.0–2.0)
EOS ABS: 0.2 10*3/uL (ref 0.0–0.5)
EOS%: 3.1 % (ref 0.0–7.0)
HCT: 43.7 % (ref 34.8–46.6)
HGB: 14.5 g/dL (ref 11.6–15.9)
LYMPH#: 2.3 10*3/uL (ref 0.9–3.3)
LYMPH%: 34.3 % (ref 14.0–48.0)
MCH: 31.5 pg (ref 26.0–34.0)
MCHC: 33.2 g/dL (ref 32.0–36.0)
MCV: 95 fL (ref 81–101)
MONO#: 0.5 10*3/uL (ref 0.1–0.9)
MONO%: 7.6 % (ref 0.0–13.0)
NEUT#: 3.7 10*3/uL (ref 1.5–6.5)
NEUT%: 54.4 % (ref 39.6–80.0)
PLATELETS: 258 10*3/uL (ref 145–400)
RBC: 4.61 10*6/uL (ref 3.70–5.32)
RDW: 14.1 % (ref 11.1–15.7)
WBC: 6.8 10*3/uL (ref 3.9–10.0)

## 2017-01-13 MED ORDER — BENZOCAINE-RESORCINOL 5-2 % VA CREA: TOPICAL_CREAM | Freq: Every day | VAGINAL | 0 refills | Status: DC

## 2017-01-13 NOTE — Progress Notes (Signed)
Hematology and Oncology Follow Up Visit  Laura Landry [id number] 08/08/1955 61 y.o. 01/13/2017   Principle Diagnosis:   Stage IIB (T2N1M0) invasive lobular carcinoma of the LEFT breast- ER+/HER2-;  Oncotype score is 26  Current Therapy:    Taxotere/Cytoxan - s/p cycle #4 - completed in 07/15/2016  Status post mastectomy in September 2017, followed by reconstructive surgery in January 2018  Patient declined radiation therapy and aromatase inhibitor therapy     Interim History:  Laura Landry is back for follow-up. Laura Landry is doing okay. There are some issues that Laura Landry is having. These are quite personal issues. The main issue is the difficulty with intimacy. Laura Landry says it is very painful. Laura Landry is very dry in the "personal" area. I talked to her about this. I will see if some type of lubrication product might help. I recommended that they try some coconut oil. This is very natural.  Laura Landry and her husband were looking up information. They heard about lidocaine. I have never heard of lidocaine been used for this type of application.   Otherwise, Laura Landry is doing okay. Laura Landry does have some issues regarding her reconstruction. Laura Landry is not too happy with the result. Laura Landry would like to see that another reconstruct the surgeon can see her. I am not sure of who that might be. I will see if I can do some research about this.   There's been no problems with fever or sweats. Laura Landry's had no cough or shortness of breath. There is no change in bowel or bladder habits.   Laura Landry's had no rashes. Laura Landry does have healing poison oak.   Laura Landry and her husband are planning a big trip up [place] to [place] in September.   Overall, her performance status is ECOG 1.  Medications:  Current Outpatient Prescriptions:  .  albuterol (PROVENTIL HFA;VENTOLIN HFA) 108 (90 Base) MCG/ACT inhaler, Inhale 1-2 puffs into the lungs every 6 (six) hours as needed for wheezing or shortness of breath., Disp: 1 Inhaler, Rfl: 0 .  Calcium  Carbonate-Vitamin D (CALCIUM-VITAMIN D) 500-200 MG-UNIT tablet, Take 2 tablets by mouth daily. , Disp: , Rfl:  .  clarithromycin (BIAXIN) 500 MG tablet, Take 1 hour before dental procedure, Disp: 1 tablet, Rfl: 0 .  glucosamine-chondroitin 500-400 MG tablet, Take 3 tablets by mouth daily., Disp: , Rfl:  .  hydrochlorothiazide (HYDRODIURIL) 25 MG tablet, Take 1 tablet (25 mg total) by mouth daily., Disp: 30 tablet, Rfl: 4 .  levothyroxine (SYNTHROID, LEVOTHROID) 100 MCG tablet, Take 1 tablet (100 mcg total) by mouth daily., Disp: 90 tablet, Rfl: 1 .  Multiple Vitamins-Minerals (ALIVE WOMENS 50+ PO), Take 1 tablet by mouth daily. , Disp: , Rfl:  .  naphazoline-pheniramine (NAPHCON-A) 0.025-0.3 % ophthalmic solution, Place 1 drop into both eyes 4 (four) times daily as needed for irritation or allergies., Disp: , Rfl:  .  SALINE NASAL MIST NA, Place 1 spray into the nose daily as needed (nasal congestion)., Disp: , Rfl:   Allergies:  Allergies  Allergen Reactions  . Clindamycin/Lincomycin Swelling    THROAT SWELLS  . Eggs Or Egg-Derived Products Swelling and Other (See Comments)    ABDOMINAL PAIN SWELLING, SWELLING REACTION UNSPECIFIED    . Ciprofloxacin Rash  . Codeine Hives and Swelling    SWELLING REACTION UNSPECIFIED   . Nickel Other (See Comments)    BLISTERS   . Penicillins Hives, Nausea And Vomiting, Swelling and Rash    SWELLING REACTION UNSPECIFIED  Has patient had PCN reaction causing  immediate rash, facial/tongue/throat swelling, SOB or lightheadedness with hypotension: YES PCN reaction causing severe rash involving mucus membranes or skin necrosis: NO PCN reaction that required hospitalization NO PCN reaction occurring within the last 10 years: NO If all of the above answers are "NO", then may proceed with Cephalosporin use.  . Sulfa Antibiotics Hives and Swelling    SWELLING REACTION UNSPECIFIED   . Eszopiclone Palpitations  . Neulasta [Pegfilgrastim] Palpitations     Stabbing back pain, sob    Past Medical History, Surgical history, Social history, and Family History were reviewed and updated.  Review of Systems:  As above  Physical Exam:  vitals were not taken for this visit.  Wt Readings from Last 3 Encounters:  10/01/16 153 lb (69.4 kg)  08/25/16 152 lb 12.8 oz (69.3 kg)  07/15/16 149 lb 6.4 oz (67.8 kg)      Well-developed well-nourished white female in no obvious distress. Head and neck exam shows no ocular or oral lesions. Laura Landry has no palpable cervical or supraclavicular lymph nodes. Lungs are clear bilaterally. Cardiac exam regular rate and rhythm with no murmurs, rubs or bruits. Breast exam shows right breast with no masses, edema or erythema. There is no right axillary adenopathy. Left chest wall shows the reconstructed breast. This is healed nicely. Laura Landry has no palpable nodules. There is no palpable left axillary adenopathy. Abdomen soft. Laura Landry has good bowel sounds. There is no fluid wave. There is no palpable liver or spleen tip. Active exam shows no tenderness over the spine, ribs or hips. External shows no clubbing, cyanosis or edema. There is no lymphedema of the left arm. Skin exam shows no rashes, ecchymoses or petechia. Neurological exam shows no focal neurological deficits.  Lab Results  Component Value Date   WBC 6.8 01/13/2017   HGB 14.5 01/13/2017   HCT 43.7 01/13/2017   MCV 95 01/13/2017   PLT 258 01/13/2017     Chemistry      Component Value Date/Time   NA 142 01/13/2017 1000   NA 141 10/01/2016 1056   K 4.0 01/13/2017 1000   K 3.7 10/01/2016 1056   CL 110 (H) 01/13/2017 1000   CO2 26 01/13/2017 1000   CO2 25 10/01/2016 1056   BUN 4 (L) 01/13/2017 1000   BUN 5.9 (L) 10/01/2016 1056   CREATININE 0.6 01/13/2017 1000   CREATININE 0.7 10/01/2016 1056   GLU 82 05/07/2015      Component Value Date/Time   CALCIUM 10.3 01/13/2017 1000   CALCIUM 10.1 10/01/2016 1056   ALKPHOS 63 01/13/2017 1000   ALKPHOS 66 10/01/2016  1056   AST 25 01/13/2017 1000   AST 15 10/01/2016 1056   ALT 19 01/13/2017 1000   ALT 9 10/01/2016 1056   BILITOT 0.80 01/13/2017 1000   BILITOT 0.39 10/01/2016 1056         Impression and Plan: Laura Landry is A 61 year old postmenopausal white female with a stage IIb invasive lobular carcinoma the left breast. Her tumor is ER positive.Laura Landry had one positive lymph node. There is no extranodal extension. Laura Landry does have the high Oncotype score (26), which is why we are using chemotherapy.  So far, I denies any issues with respect to recurrent disease.  Hopefully, the issues with respect to intimacy can be taken care of.  I will see her back in about 4 months. I will see if we can refer to reconstructive surgery.  [name], MD 6/13/201810:42 AM

## 2017-01-14 ENCOUNTER — Telehealth: Payer: Self-pay | Admitting: *Deleted

## 2017-01-14 LAB — VITAMIN D 25 HYDROXY (VIT D DEFICIENCY, FRACTURES): Vitamin D, 25-Hydroxy: 39.5 ng/mL (ref 30.0–100.0)

## 2017-01-14 NOTE — Telephone Encounter (Addendum)
Patient is aware of results  ----- Message from [name], MD sent at 01/14/2017 12:05 PM EDT ----- Call - vit d level is ok!!  Laura Landry

## 2017-05-18 ENCOUNTER — Other Ambulatory Visit: Payer: BLUE CROSS/BLUE SHIELD

## 2017-05-18 ENCOUNTER — Ambulatory Visit: Payer: BLUE CROSS/BLUE SHIELD | Admitting: Hematology & Oncology

## 2017-05-28 IMAGING — MR MR BREAST BILAT WO/W CM
8 of 12 series · 33 of 48 positions shown · IV contrast (13ml multihance)
Comparison: Previous exam(s).

CLINICAL DATA: Recent diagnosis of left breast cancer.

LABS:  Does not apply
EXAM:
BILATERAL BREAST MRI WITH AND WITHOUT CONTRAST
TECHNIQUE: Multiplanar, multisequence MR images of both breasts were obtained
prior to and following the intravenous administration of 13 ml of
MultiHance.

[Series 2: t2_tirm_tra ipat (a-p) · axial · 3.0mm · 0.66mm/px · 1 of 55 slices shown]
[im 1/55]
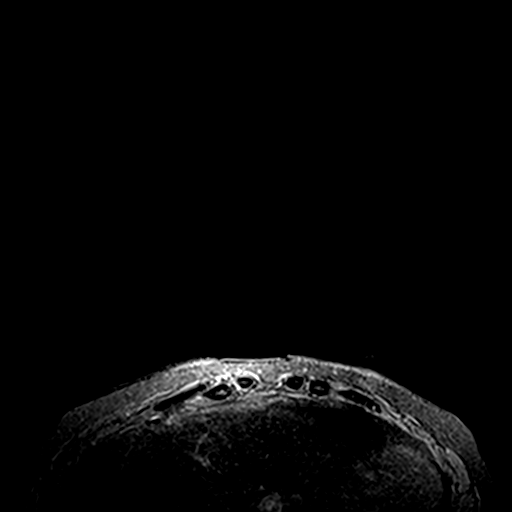

[Series 3: fl3d pre-cm no · axial · non-contrast · 1.2mm · 0.89mm/px · z∈[-83,+89]mm · 5 of 144 slices shown]
[im 1/144]
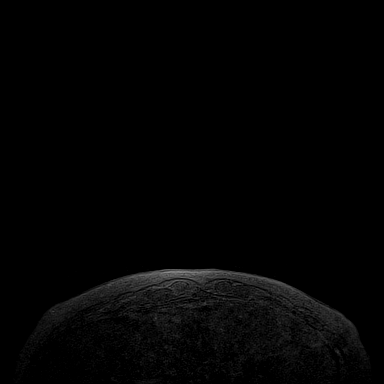
[im 36/144]
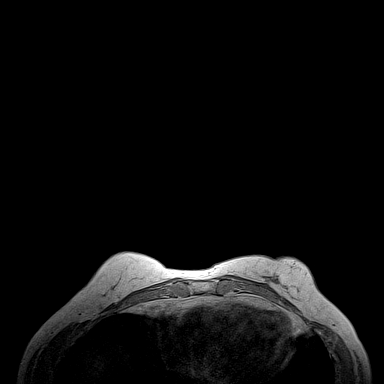
[im 72/144]
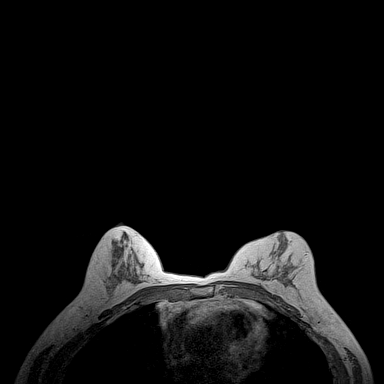
[im 108/144]
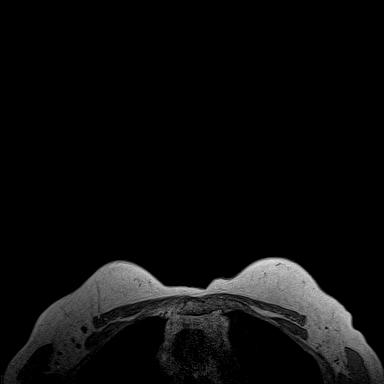
[im 144/144]
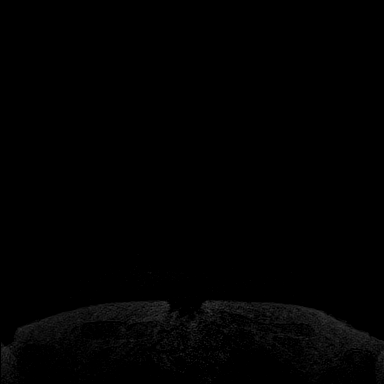

[Series 4: fl3d pre-cm · axial · non-contrast · 1.2mm · 0.89mm/px · z∈[-83,+89]mm · 5 of 144 slices shown]
[im 1/144]
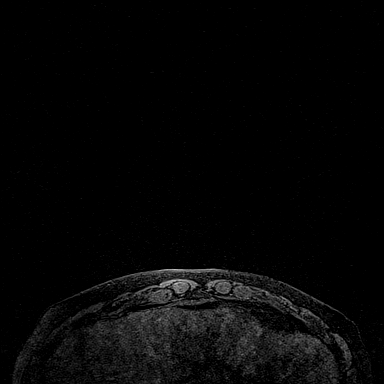
[im 36/144]
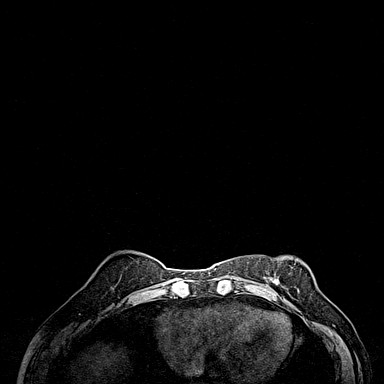
[im 72/144]
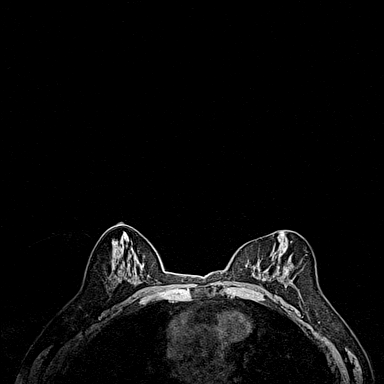
[im 108/144]
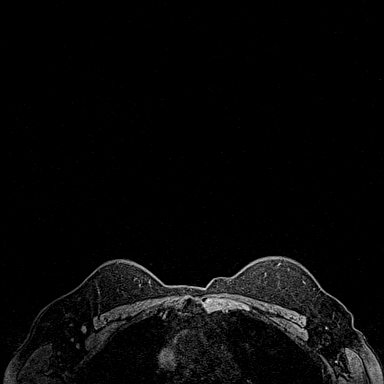
[im 144/144]
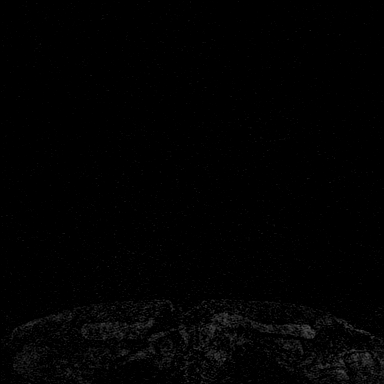

[Series 5: fl3d post immediate · axial · 1.2mm · 0.89mm/px · z∈[-83,+89]mm · 5 of 144 slices shown (1 of 3)]
[im 1/144]
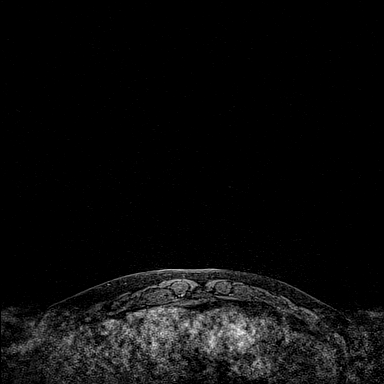
[im 36/144]
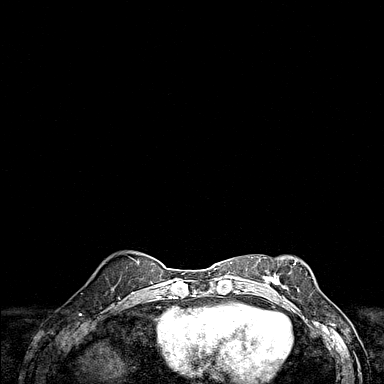
[im 72/144]
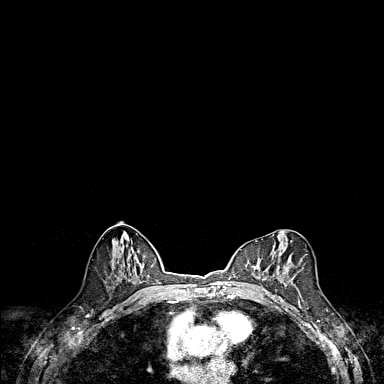
[im 108/144]
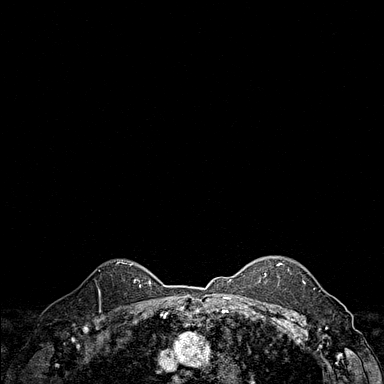
[im 144/144]
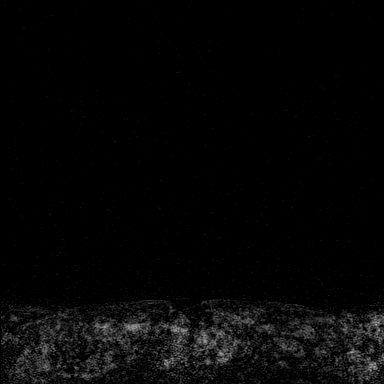

[Series 6: fl3d post immediate · axial · 1.2mm · 0.89mm/px · z∈[-83,+89]mm · 5 of 144 slices shown (2 of 3)]
[im 1/144]
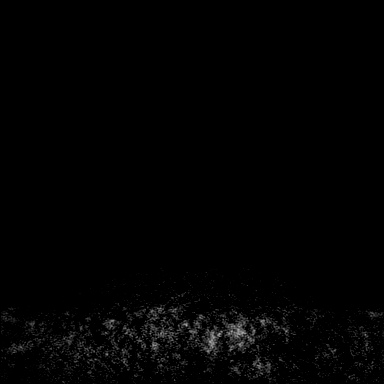
[im 36/144]
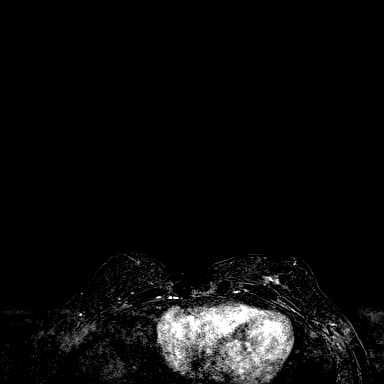
[im 72/144]
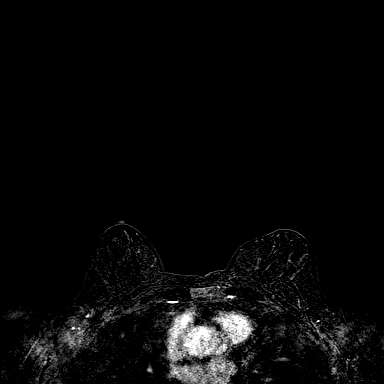
[im 108/144]
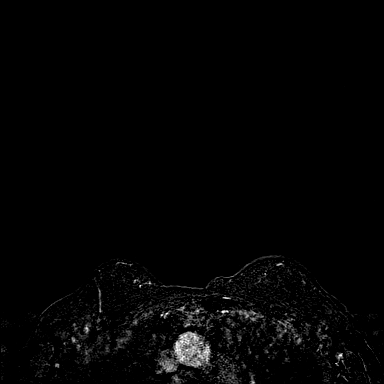
[im 144/144]
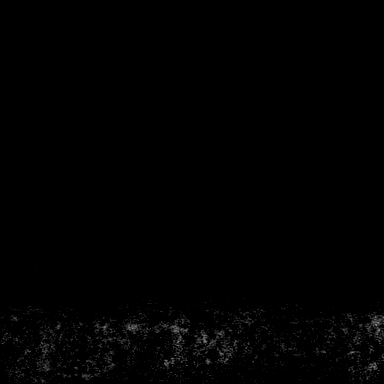

[Series 7: fl3d post immediate · axial · 172.8mm · 0.89mm/px · 1 of 1 slices shown (3 of 3)]
[im 1/1]
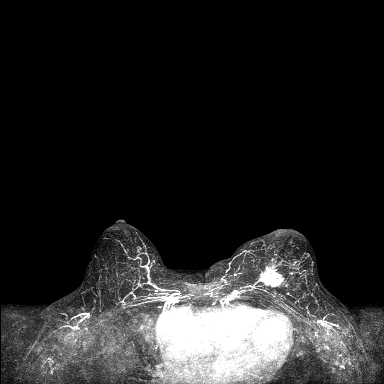

[Series 8: fl3d post 3min · axial · 1.2mm · 0.89mm/px · z∈[-83,+89]mm · 6 of 144 slices shown]
[im 1/144]
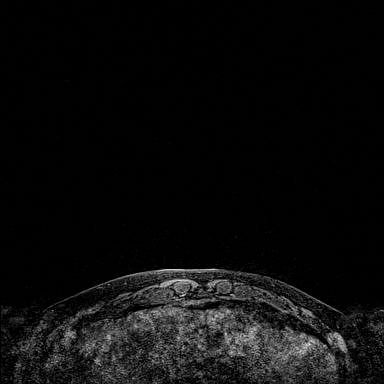
[im 29/144]
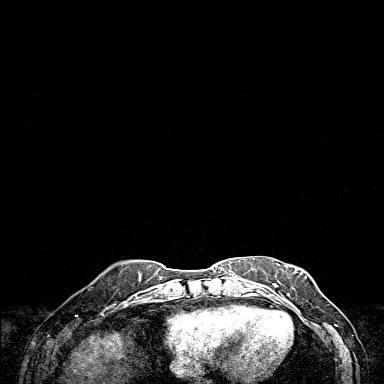
[im 58/144]
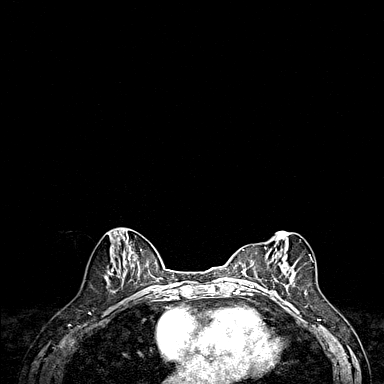
[im 86/144]
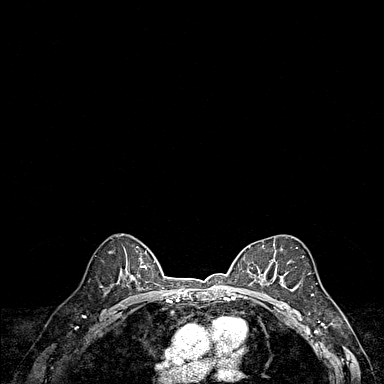
[im 115/144]
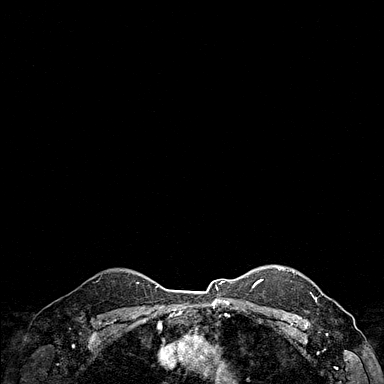
[im 144/144]
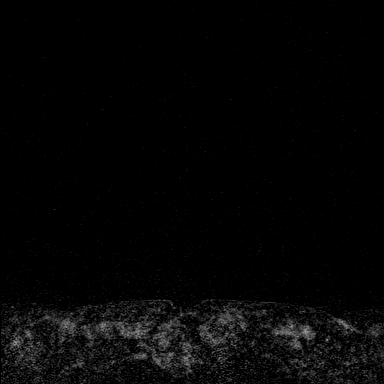

[Series 9: fl3d post 3min_sub · axial · 1.2mm · 0.89mm/px · z∈[-83,+54]mm · 5 of 144 slices shown]
[im 1/144]
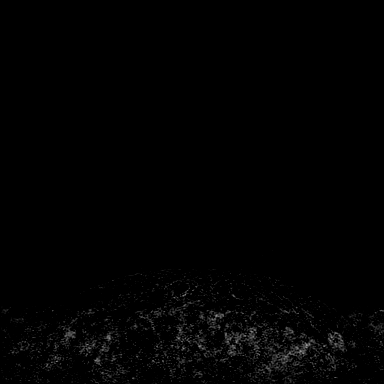
[im 29/144]
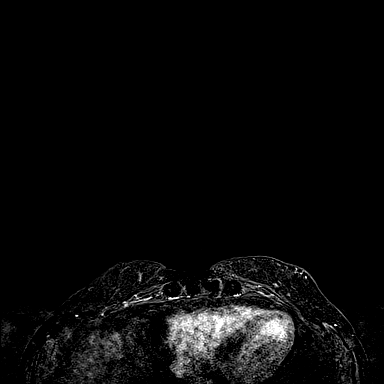
[im 58/144]
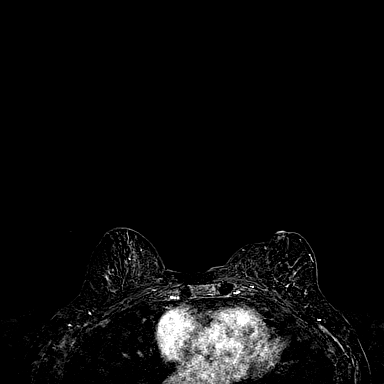
[im 86/144]
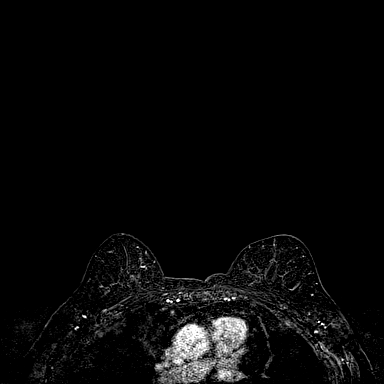
[im 115/144]
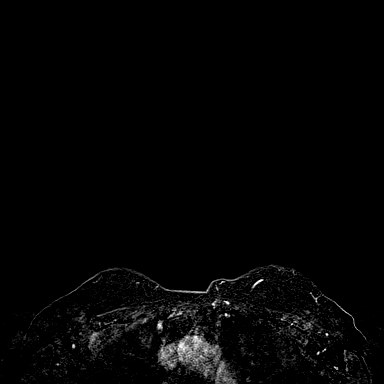

[33 of 48 positions shown; findings below may reference images not displayed]

THREE-DIMENSIONAL MR IMAGE RENDERING ON INDEPENDENT WORKSTATION:

Three-dimensional MR images were rendered by post-processing of the
original MR data on an independent workstation. The
three-dimensional MR images were interpreted, and findings are
reported in the following complete MRI report for this study. Three
dimensional images were evaluated at the independent [employer]
workstation
FINDINGS: Breast composition: c. Heterogeneous fibroglandular tissue.

Background parenchymal enhancement: Mild.

Right breast: No mass or abnormal enhancement.

Left breast: There is a 2.6 x 1.8 x 2.7 cm spiculated enhancing mass
with associated biopsy clip demonstrating washout enhancement
kinetics at the posterior depth left breast 6-7 o'clock. The mass
abuts the subjacent rib and muscle. There is no abnormal signal
within the adjacent rib to suggest invasion in the adjacent rib.
Invasion to the superficial aspect of the subjacent muscle/facia is
not excluded.

Lymph nodes: No abnormal appearing lymph nodes.

Ancillary findings:  None.
IMPRESSION: Mass at the posterior left breast 6-7 o'clock correlating to the
patient's known recent biopsy cancer. The mass abuts the subjacent
rib and muscle. There is no abnormal signal within the adjacent rib
to suggest invasion in the rib. Invasion to the superficial aspect
of the subjacent muscle/facia is not excluded.

RECOMMENDATION:
Treatment plan

BI-RADS CATEGORY  6: Known biopsy-proven malignancy.

## 2017-07-08 IMAGING — CR DG CHEST 1V PORT
1 series · 1 of 1 positions shown · non-contrast
Comparison: None.

CLINICAL DATA: Porta catheter placement.

EXAM:
PORTABLE CHEST 1 VIEW

[AP]
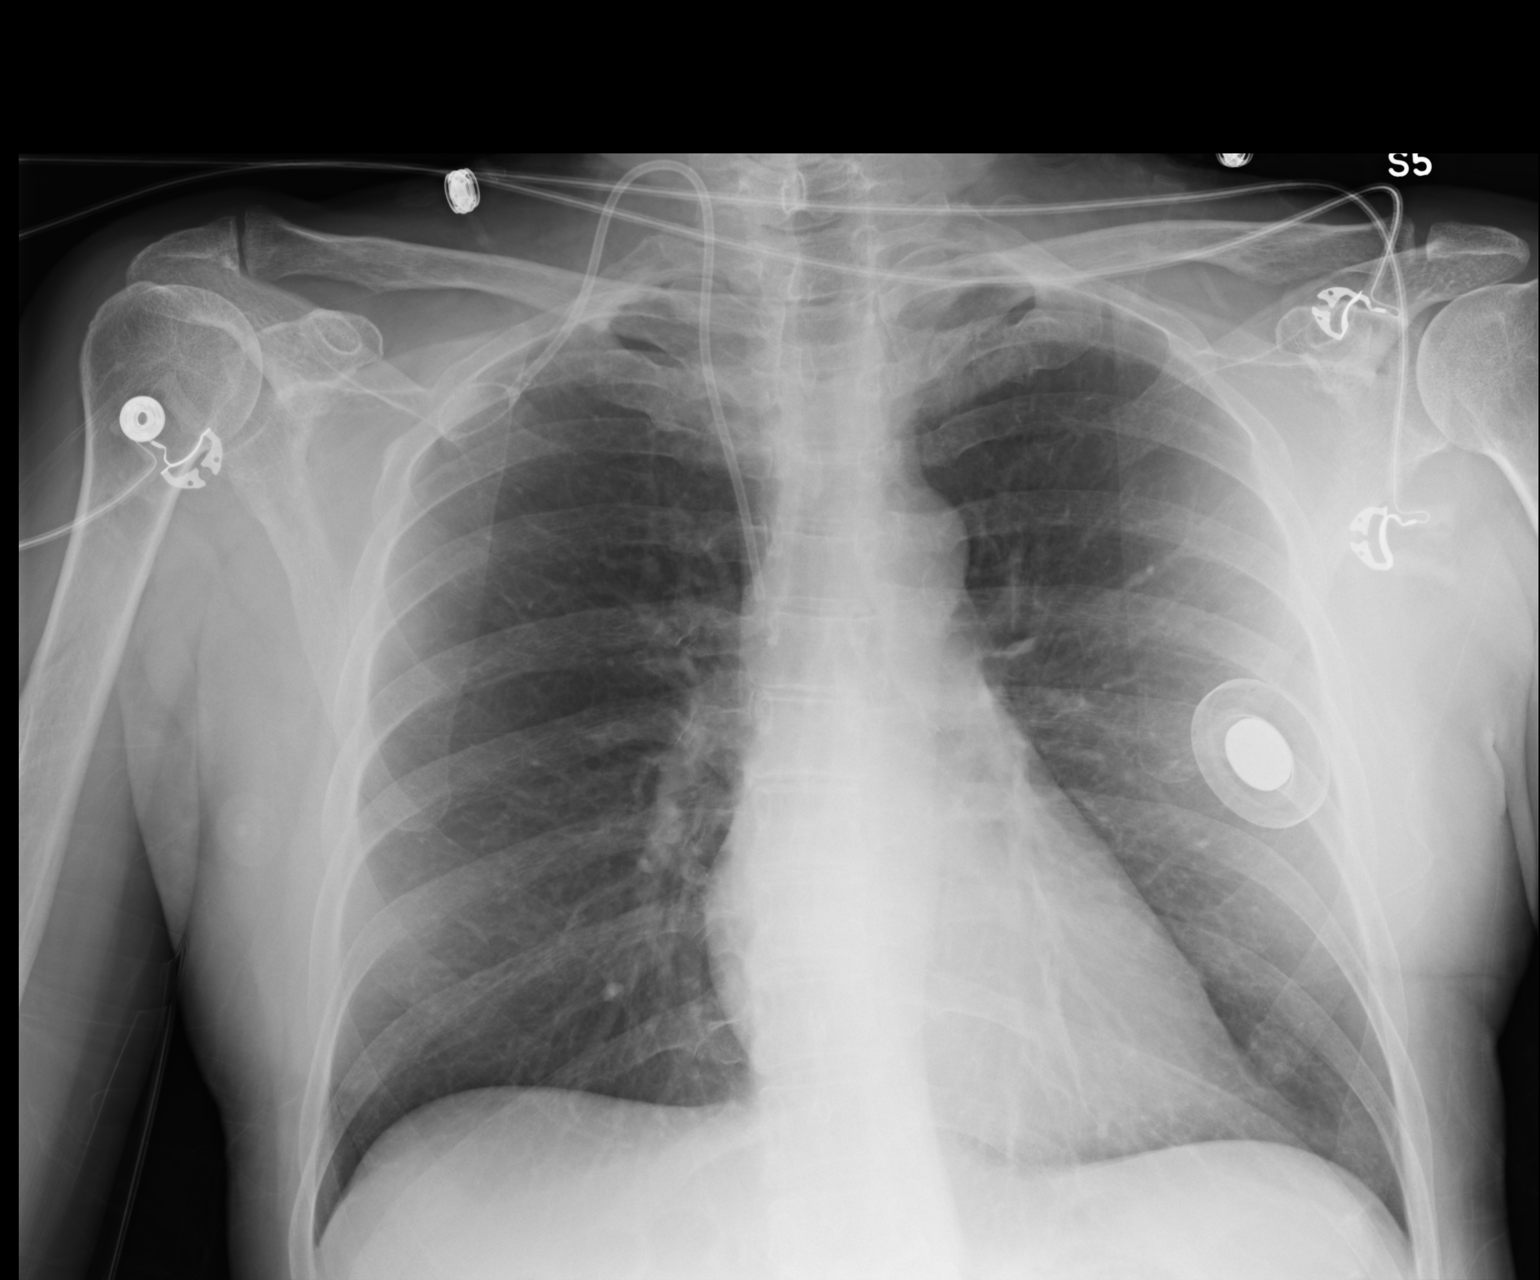

[1 of 1 positions shown; findings below may reference images not displayed]

FINDINGS: Right anterior chest wall Port-A-Cath has been inserted through the
right internal jugular vein. The tip projects in the mid superior
vena cava.

No pneumothorax.

Heart, mediastinum and hila are unremarkable.  The lungs are clear.
IMPRESSION: 1. Right anterior chest wall Port-A-Cath tip projects in the mid
superior vena cava.
2. No pneumothorax.
3. No active cardiopulmonary disease.

## 2017-08-09 ENCOUNTER — Encounter: Payer: Self-pay | Admitting: Hematology & Oncology

## 2017-08-17 ENCOUNTER — Other Ambulatory Visit: Payer: Self-pay | Admitting: Radiology

## 2017-09-21 ENCOUNTER — Encounter: Payer: Self-pay | Admitting: Family Medicine

## 2018-02-28 ENCOUNTER — Telehealth: Payer: Self-pay | Admitting: Family Medicine

## 2018-02-28 ENCOUNTER — Ambulatory Visit: Payer: BLUE CROSS/BLUE SHIELD

## 2018-02-28 NOTE — Telephone Encounter (Signed)
Patient called states Mychart shows she is overdue for Tdap vaccine & request nurse to review records to see if she needs any others (---Patient wants to get all /any that she is in need of at this visit).   ---Forwarding request to medical assistant.  --Dion Body

## 2018-03-01 ENCOUNTER — Ambulatory Visit (INDEPENDENT_AMBULATORY_CARE_PROVIDER_SITE_OTHER): Payer: BLUE CROSS/BLUE SHIELD

## 2018-03-01 VITALS — BP 144/84 | HR 81 | Temp 97.8°F

## 2018-03-01 DIAGNOSIS — Z23 Encounter for immunization: Secondary | ICD-10-CM

## 2018-03-01 NOTE — Progress Notes (Signed)
Patient came in today to update tdap.  Patient tolerated injection well. MPulliam, CMA/RT(R)

## 2018-03-01 NOTE — Telephone Encounter (Signed)
Patient came in for tdap. MPulliam, CMA/RT(R)

## 2018-03-07 ENCOUNTER — Ambulatory Visit: Payer: BLUE CROSS/BLUE SHIELD | Admitting: Family Medicine

## 2018-03-07 ENCOUNTER — Encounter: Payer: Self-pay | Admitting: Family Medicine

## 2018-03-07 VITALS — BP 162/89 | HR 85 | Ht 65.0 in | Wt 158.7 lb

## 2018-03-07 DIAGNOSIS — R911 Solitary pulmonary nodule: Secondary | ICD-10-CM

## 2018-03-07 DIAGNOSIS — G939 Disorder of brain, unspecified: Secondary | ICD-10-CM | POA: Diagnosis not present

## 2018-03-07 DIAGNOSIS — C50312 Malignant neoplasm of lower-inner quadrant of left female breast: Secondary | ICD-10-CM | POA: Diagnosis not present

## 2018-03-07 DIAGNOSIS — G9389 Other specified disorders of brain: Secondary | ICD-10-CM

## 2018-03-07 DIAGNOSIS — Z170 Estrogen receptor positive status [ER+]: Secondary | ICD-10-CM | POA: Diagnosis not present

## 2018-03-07 NOTE — Progress Notes (Addendum)
Impression and Recommendations:    1. 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 Malignant neoplasm of lower-inner quadrant of left female breast, unspecified estrogen receptor status ([facility])   3. Lung nodule-  new spot followed by ONC docs   4. 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, followed by oncology at [place] -Encouraged the patient to continue voicing her concerns to her current oncology team -Patient is now transferred to [facility] and she will continue her comprehensive CA care there   Mood -Encouraged patient to continue finding activities that she loves and keeping herself busy to reduce stress and frustration -Encouraged exercise, diet, counseling, prayer or meditation to help stay positive and continue to elevate mood -Patient declines need for mood medications and states she is in good spirits and ready to move forward   HTN -Discussed red flag symptoms and to go to the ER if she started experiencing them -Requested patient to bring in a journal of ambulatory BP readings-bring in next OV -Blood pressures have been well controlled at physician's offices and per patient in the 120s over 70s or less on a regular basis   Bone Density -Discussed fall prevention and the importance of diet in maintaining bone health -Encouraged patient to consider medicinal options for improving bone health-she is having her oncologist and other cancer specialists do bone density and possible treatment   Pt was in the office today for 32.5+ minutes, with over 50% time spent in face to face counseling of patients various medical conditions, treatment plans of those medical conditions including medicine management and lifestyle modification, strategies to improve health and well being; and in coordination of care. SEE ABOVE TREATMENT PLAN FOR DETAILS   Please see AVS handed out to patient at the end of our  visit for further patient instructions/ counseling done pertaining to today's office visit.   Return for Follow-up for Pap smear in future at patient convenience.    Note:  This note was prepared with assistance of Dragon voice recognition software. Occasional wrong-word or sound-a-like substitutions may have occurred due to the inherent limitations of voice recognition software.  I have reviewed the above documentation for accuracy and completeness, and I agree with the above.    This document serves as a record of services personally performed by Mellody Dance, MD. It was created on her behalf by Georga Bora, a trained medical scribe. The creation of this record is based on the scribe's personal observations and the provider's statements to them.   I have reviewed the above medical documentation for accuracy and completeness and I concur.  Mellody Dance 03/07/18 5:40 PM    --------------------------------------------------------------------------------------------------------------------------------------------------------------------------------------------------------------------------    Subjective:     HPI: Talah Cookston is a 62 y.o. female who presents to [place] at [facility] today for issues as discussed below.   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 -States she likes to stay active working in the yard and completing projects in her house -States she's only been eating fresh and organic food, mostly fruit, vegetables, and meat -States her husband's memory has been declining, that he can only remember one thing at a time, and that his tremor is getting worse -Says her husband's neurologist stated he had short term memory loss  [place] -Patient took much time during OV today to express her significant frustrations with [place] during breast cancer treatment  process, reconstruction, and oncology treatments -Patient states she was not  clearly notified about a surgical biopsy showing unclear margins following her breast tumor removal, and hence did not choose radiation  - she had a recurrence CA in that same breast following the reconstruction surgery, and states she was told by a couple different doctors that could not occur  -Patient has moved her care to [facility] and encouraged her father to move to [place] with his current lung cancer diagnosis -States cancer has been reduced 70% in size on letrazole only treatments as directed by [facility] oncologist and various treatment doctors at [facility] -Patient's current plan is to continue medication until her surgical oncologist is confident in removal of the tumor; eventually states radiation will be needed -Patient feel much more confident in her current care team and feels she can tackle this diagnosis   -Experienced allergies and negative side effects with Tamoxifen- unable to tolerate medicine  Mood -Patient stated she spent a few weeks of anger after finding that the margins of her tumor were undefined, but she decided she has to move forward and will not linger in sadness/in frustration -Is keeping herself busy and refusing to let her diagnosis define her -States she has only told a few people because she wants to continue moving forward and remain positive about her future  -Patient says her only issues sleeping are when she has to get up to go to the bathroom  HTN -States she regularly has normal blood pressure in the doctor's office -her regular readings are usually around 130s/70s or less -Has not been conducting ambulatory BP testing due to testing frequency at her appointments -Denies any symptoms or concerns   Bone Density -States she has had a bone scan - showing osteopenia  -Patient understands her chemotherapy can have an negative effect on bone density  -States her left leg is worse than the rest of her readings in terms of density of bone    Wt Readings from Last 3  Encounters:  03/07/18 158 lb 11.7 oz (72 kg)  01/13/17 149 lb (67.6 kg)  10/01/16 153 lb (69.4 kg)   BP Readings from Last 3 Encounters:  03/07/18 (!) 162/89  03/01/18 (!) 144/84  01/13/17 (!) 146/84   Pulse Readings from Last 3 Encounters:  03/07/18 85  03/01/18 81  01/13/17 74   BMI Readings from Last 3 Encounters:  03/07/18 26.41 kg/m  01/13/17 24.79 kg/m  10/01/16 25.46 kg/m     Patient Care Team    Relationship Specialty Notifications Start End  Mellody Dance, DO PCP - General Family Medicine  01/07/16   Rolm Bookbinder, MD Consulting Physician General Surgery  03/13/16   Gery Pray, [place] Physician Radiation Oncology  03/13/16   Irene Limbo, MD Consulting Physician Plastic Surgery  04/10/16   Lenard Simmer, MD Attending Physician Endocrinology  05/11/16   [name], MD Consulting Physician Oncology  07/15/16      Patient Active Problem List   Diagnosis Date Noted  . Anxiety 05/11/2016  . Fatigue 05/11/2016  . Environmental and seasonal allergies 05/11/2016  . 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 Breast cancer ([place]) 04/14/2016  . Carcinoma of lower-inner quadrant of left breast ([place]) 03/30/2016  . Breast cancer of lower-inner quadrant of left female breast ([place]) 03/09/2016  . Former smoker 02/23/2016  . White coat syndrome without diagnosis of hypertension 02/23/2016  . Prehypertension 01/09/2016  . Hypothyroidism 01/08/2016  . Rhus dermatitis 01/07/2016  Past Medical history, Surgical history, Family history, Social history, Allergies and Medications have been entered into the medical record, reviewed and changed as needed.    Current Meds  Medication Sig  . albuterol (PROVENTIL HFA;VENTOLIN HFA) 108 (90 Base) MCG/ACT inhaler Inhale 1-2 puffs into the lungs every 6 (six) hours as needed for wheezing or shortness of breath.  . Calcium Carbonate-Vitamin D (CALCIUM-VITAMIN D) 500-200 MG-UNIT tablet Take 2  tablets by mouth daily.   . Cholecalciferol (VITAMIN D3) 5000 units CAPS Take 1 capsule by mouth daily.  Marland Kitchen letrozole (FEMARA) 2.5 MG tablet Take 1 tablet by mouth daily.  . Multiple Vitamins-Minerals (ALIVE WOMENS 50+ PO) Take 1 tablet by mouth daily.   . naphazoline-pheniramine (NAPHCON-A) 0.025-0.3 % ophthalmic solution Place 1 drop into both eyes 4 (four) times daily as needed for irritation or allergies.  . Probiotic Product (DIGESTIVE ADVANTAGE PO) Take 1 tablet by mouth 3 (three) times daily.  Marland Kitchen SALINE NASAL MIST NA Place 1 spray into the nose daily as needed (nasal congestion).  Marland Kitchen thyroid (ARMOUR) 90 MG tablet Take 90 mg by mouth daily.  . Wheat Dextrin (BENEFIBER PO) Take 3 tablets by mouth daily.    Allergies:  Allergies  Allergen Reactions  . Clindamycin/Lincomycin Swelling    THROAT SWELLS  . Eggs Or Egg-Derived Products Swelling and Other (See Comments)    ABDOMINAL PAIN SWELLING, SWELLING REACTION UNSPECIFIED   Abdominal pain ABDOMINAL PAIN SWELLING, SWELLING REACTION UNSPECIFIED    . Ciprofloxacin Rash  . Codeine Hives and Swelling    SWELLING REACTION UNSPECIFIED   . Nickel Other (See Comments)    BLISTERS   . Penicillins Hives, Nausea And Vomiting, Swelling and Rash    SWELLING REACTION UNSPECIFIED  Has patient had PCN reaction causing immediate rash, facial/tongue/throat swelling, SOB or lightheadedness with hypotension: YES PCN reaction causing severe rash involving mucus membranes or skin necrosis: NO PCN reaction that required hospitalization NO PCN reaction occurring within the last 10 years: NO If all of the above answers are "NO", then may proceed with Cephalosporin use.  . Sulfa Antibiotics Hives and Swelling    SWELLING REACTION UNSPECIFIED   . Sulfasalazine Hives and Swelling    SWELLING REACTION UNSPECIFIED  . Gadolinium Derivatives Hives    After Brain and Breast MRIs  . Tamoxifen Other (See Comments)    Hands swollen, felt like hot flashes  through her body, did not tolerate at all  . Eszopiclone Palpitations  . Neulasta [Pegfilgrastim] Palpitations    Stabbing back pain, sob     Review of Systems:  A fourteen system review of systems was performed and found to be positive as per HPI.   Objective:   Blood pressure (!) 162/89, pulse 85, height 5\' 5"  (1.651 m), weight 158 lb 11.7 oz (72 kg), SpO2 98 %. Body mass index is 26.41 kg/m. General:  Well Developed, well nourished, appropriate for stated age.  Neuro:  Alert and oriented,  extra-ocular muscles intact  HEENT:  Normocephalic, atraumatic, neck supple, no carotid bruits appreciated  Skin:  no gross rash, warm, pink. Cardiac:  RRR, S1 S2 Respiratory:  ECTA B/L and A/P, Not using accessory muscles, speaking in full sentences- unlabored. Vascular:  Ext warm, no cyanosis apprec.; cap RF less 2 sec. Psych:  No HI/SI, judgement and insight good, Euthymic mood. Full Affect.

## 2018-03-16 ENCOUNTER — Other Ambulatory Visit: Payer: Self-pay | Admitting: Family Medicine

## 2018-03-16 NOTE — Telephone Encounter (Signed)
RX has not been prescribed by Dr. Raliegh Scarlet since 2017.  Please review for appropriateness of refill request.  Charyl Bigger, CMA

## 2018-03-16 NOTE — Telephone Encounter (Signed)
Please call patient and see if she is using this medicine and how often she is using it.  I just saw her recently and she did not mention refill of this medicine that I can recall.

## 2018-03-17 NOTE — Telephone Encounter (Signed)
Pt states that she has not had this medication filled in approximately 2 years and that the inhaler that she had on hand has expired.  Pt states that she typically uses her inhaler less than every 2 months, but she has recently been having to push her father around in a wheelchair a lot d/t him being diagnosed with stage 4 cancer.  She feels that she is having more SOB and just wants to have an unexpired inhaler on hand if she needs it.  Charyl Bigger, CMA

## 2018-03-17 NOTE — Telephone Encounter (Signed)
Thank you very much for calling and asking her.  But does seem vaguely familiar to me so no problem please call her in a prescription of albuterol.  1-2 puffs every 4-6 hours as needed shortness of breath or wheeze.  Dispense 1 with 1 refill.

## 2018-03-17 NOTE — Telephone Encounter (Signed)
Dr. Raliegh Scarlet has already approved this medication refill prior to sending me message requesting that I send in RX.  No additional RXs were sent to pharmacy by this writer.  Charyl Bigger, CMA

## 2018-05-07 ENCOUNTER — Ambulatory Visit (HOSPITAL_COMMUNITY)
Admission: EM | Admit: 2018-05-07 | Discharge: 2018-05-08 | Disposition: A | Discharge status: Home / Self Care | Payer: BLUE CROSS/BLUE SHIELD | Attending: Emergency Medicine | Admitting: Emergency Medicine

## 2018-05-07 DIAGNOSIS — S82841A Displaced bimalleolar fracture of right lower leg, initial encounter for closed fracture: Secondary | ICD-10-CM | POA: Insufficient documentation

## 2018-05-07 DIAGNOSIS — Y93K1 Activity, walking an animal: Secondary | ICD-10-CM | POA: Insufficient documentation

## 2018-05-07 DIAGNOSIS — Z79890 Hormone replacement therapy (postmenopausal): Secondary | ICD-10-CM | POA: Diagnosis not present

## 2018-05-07 DIAGNOSIS — E039 Hypothyroidism, unspecified: Secondary | ICD-10-CM | POA: Diagnosis not present

## 2018-05-07 DIAGNOSIS — W1830XA Fall on same level, unspecified, initial encounter: Secondary | ICD-10-CM | POA: Diagnosis not present

## 2018-05-07 DIAGNOSIS — J45909 Unspecified asthma, uncomplicated: Secondary | ICD-10-CM | POA: Diagnosis not present

## 2018-05-07 DIAGNOSIS — Z87891 Personal history of nicotine dependence: Secondary | ICD-10-CM | POA: Insufficient documentation

## 2018-05-07 DIAGNOSIS — C50919 Malignant neoplasm of unspecified site of unspecified female breast: Secondary | ICD-10-CM

## 2018-05-07 DIAGNOSIS — Z853 Personal history of malignant neoplasm of breast: Secondary | ICD-10-CM | POA: Insufficient documentation

## 2018-05-07 DIAGNOSIS — T148XXA Other injury of unspecified body region, initial encounter: Secondary | ICD-10-CM

## 2018-05-07 DIAGNOSIS — X501XXA Overexertion from prolonged static or awkward postures, initial encounter: Secondary | ICD-10-CM | POA: Diagnosis not present

## 2018-05-07 DIAGNOSIS — Z9012 Acquired absence of left breast and nipple: Secondary | ICD-10-CM | POA: Insufficient documentation

## 2018-05-07 DIAGNOSIS — S82899A Other fracture of unspecified lower leg, initial encounter for closed fracture: Secondary | ICD-10-CM

## 2018-05-07 DIAGNOSIS — Z79899 Other long term (current) drug therapy: Secondary | ICD-10-CM | POA: Diagnosis not present

## 2018-05-07 DIAGNOSIS — S82891A Other fracture of right lower leg, initial encounter for closed fracture: Secondary | ICD-10-CM

## 2018-05-07 DIAGNOSIS — W19XXXA Unspecified fall, initial encounter: Secondary | ICD-10-CM

## 2018-05-07 DIAGNOSIS — Z79811 Long term (current) use of aromatase inhibitors: Secondary | ICD-10-CM | POA: Diagnosis not present

## 2018-05-08 ENCOUNTER — Encounter (HOSPITAL_COMMUNITY)
Admission: EM | Disposition: A | Discharge status: Home / Self Care | Payer: Self-pay | Source: Home / Self Care | Attending: Emergency Medicine

## 2018-05-08 ENCOUNTER — Emergency Department (HOSPITAL_COMMUNITY): Payer: BLUE CROSS/BLUE SHIELD

## 2018-05-08 ENCOUNTER — Encounter (HOSPITAL_COMMUNITY): Payer: Self-pay | Admitting: Surgery

## 2018-05-08 ENCOUNTER — Emergency Department (HOSPITAL_COMMUNITY): Payer: BLUE CROSS/BLUE SHIELD | Admitting: Anesthesiology

## 2018-05-08 ENCOUNTER — Other Ambulatory Visit: Payer: Self-pay

## 2018-05-08 HISTORY — PX: ORIF ANKLE FRACTURE: SHX5408

## 2018-05-08 LAB — BASIC METABOLIC PANEL
Anion gap: 9 (ref 5–15)
BUN: 6 mg/dL — AB (ref 8–23)
CALCIUM: 9.2 mg/dL (ref 8.9–10.3)
CO2: 22 mmol/L (ref 22–32)
Chloride: 110 mmol/L (ref 98–111)
Creatinine, Ser: 0.74 mg/dL (ref 0.44–1.00)
GFR calc Af Amer: >60 mL/min (ref >60)
Glucose, Bld: 113 mg/dL — ABNORMAL HIGH (ref 70–99)
POTASSIUM: 4.4 mmol/L (ref 3.5–5.1)
Sodium: 141 mmol/L (ref 135–145)

## 2018-05-08 LAB — CBC WITH DIFFERENTIAL/PLATELET
Abs Immature Granulocytes: 0.1 10*3/uL (ref 0.0–0.1)
BASOS ABS: 0.1 10*3/uL (ref 0.0–0.1)
BASOS PCT: 1 %
EOS ABS: 0 10*3/uL (ref 0.0–0.7)
EOS PCT: 0 %
HCT: 42.2 % (ref 36.0–46.0)
Hemoglobin: 13.4 g/dL (ref 12.0–15.0)
IMMATURE GRANULOCYTES: 0 %
Lymphocytes Relative: 11 %
Lymphs Abs: 1.3 10*3/uL (ref 0.7–4.0)
MCH: 32.2 pg (ref 26.0–34.0)
MCHC: 31.8 g/dL (ref 30.0–36.0)
MCV: 101.4 fL — ABNORMAL HIGH (ref 78.0–100.0)
Monocytes Absolute: 0.7 10*3/uL (ref 0.1–1.0)
Monocytes Relative: 6 %
Neutro Abs: 10.3 10*3/uL — ABNORMAL HIGH (ref 1.7–7.7)
Neutrophils Relative %: 82 %
PLATELETS: 253 10*3/uL (ref 150–400)
RBC: 4.16 MIL/uL (ref 3.87–5.11)
RDW: 13.6 % (ref 11.5–15.5)
WBC: 12.4 10*3/uL — AB (ref 4.0–10.5)

## 2018-05-08 LAB — APTT: APTT: 26 s (ref 24–36)

## 2018-05-08 LAB — PROTIME-INR
INR: 1.03
Prothrombin Time: 13.5 seconds (ref 11.4–15.2)

## 2018-05-08 SURGERY — OPEN REDUCTION INTERNAL FIXATION (ORIF) ANKLE FRACTURE
Anesthesia: Regional | Site: Ankle | Laterality: Right

## 2018-05-08 MED ORDER — FENTANYL CITRATE (PF) 100 MCG/2ML IJ SOLN
INTRAMUSCULAR | Status: AC
  Filled 2018-05-08: qty 2

## 2018-05-08 MED ORDER — ROCURONIUM BROMIDE 50 MG/5ML IV SOSY
PREFILLED_SYRINGE | INTRAVENOUS | Status: AC
  Filled 2018-05-08: qty 5

## 2018-05-08 MED ORDER — FENTANYL CITRATE (PF) 100 MCG/2ML IJ SOLN
100.0000 ug | Freq: Once | INTRAMUSCULAR | Status: AC
  Administered 2018-05-08: 100 ug via INTRAVENOUS
  Filled 2018-05-08: qty 2

## 2018-05-08 MED ORDER — DEXAMETHASONE SODIUM PHOSPHATE 10 MG/ML IJ SOLN
INTRAMUSCULAR | Status: AC
  Filled 2018-05-08: qty 1

## 2018-05-08 MED ORDER — 0.9 % SODIUM CHLORIDE (POUR BTL) OPTIME
TOPICAL | Status: DC | PRN
  Administered 2018-05-08: 1000 mL

## 2018-05-08 MED ORDER — MIDAZOLAM HCL 2 MG/2ML IJ SOLN
INTRAMUSCULAR | Status: DC | PRN
  Administered 2018-05-08: 2 mg via INTRAVENOUS

## 2018-05-08 MED ORDER — ONDANSETRON HCL 4 MG/2ML IJ SOLN
INTRAMUSCULAR | Status: AC
  Filled 2018-05-08: qty 2

## 2018-05-08 MED ORDER — FENTANYL CITRATE (PF) 250 MCG/5ML IJ SOLN
INTRAMUSCULAR | Status: AC
  Filled 2018-05-08: qty 5

## 2018-05-08 MED ORDER — PROPOFOL 10 MG/ML IV BOLUS
INTRAVENOUS | Status: AC | PRN
  Administered 2018-05-08: 36.3 mg via INTRAVENOUS

## 2018-05-08 MED ORDER — PROPOFOL 10 MG/ML IV BOLUS
INTRAVENOUS | Status: AC
  Filled 2018-05-08: qty 40

## 2018-05-08 MED ORDER — FENTANYL CITRATE (PF) 100 MCG/2ML IJ SOLN
INTRAMUSCULAR | Status: AC | PRN
  Administered 2018-05-08: 100 ug via INTRAVENOUS

## 2018-05-08 MED ORDER — ONDANSETRON 4 MG PO TBDP: 4.0000 mg | ORAL_TABLET | Freq: Three times a day (TID) | ORAL | 0 refills | Status: DC | PRN

## 2018-05-08 MED ORDER — VANCOMYCIN HCL 1000 MG IV SOLR
INTRAVENOUS | Status: DC | PRN
  Administered 2018-05-08: 1000 mg via INTRAVENOUS

## 2018-05-08 MED ORDER — MORPHINE SULFATE (PF) 2 MG/ML IV SOLN
1.0000 mg | INTRAVENOUS | Status: DC | PRN
  Administered 2018-05-08 (×2): 1 mg via INTRAVENOUS
  Filled 2018-05-08 (×2): qty 1

## 2018-05-08 MED ORDER — FENTANYL CITRATE (PF) 100 MCG/2ML IJ SOLN
50.0000 ug | Freq: Once | INTRAMUSCULAR | Status: AC
  Administered 2018-05-08: 50 ug via INTRAVENOUS

## 2018-05-08 MED ORDER — LIDOCAINE 2% (20 MG/ML) 5 ML SYRINGE
INTRAMUSCULAR | Status: AC
  Filled 2018-05-08: qty 5

## 2018-05-08 MED ORDER — ASPIRIN EC 325 MG PO TBEC: 325.0000 mg | DELAYED_RELEASE_TABLET | Freq: Every day | ORAL | 0 refills | Status: DC

## 2018-05-08 MED ORDER — DEXAMETHASONE SODIUM PHOSPHATE 10 MG/ML IJ SOLN
INTRAMUSCULAR | Status: DC | PRN
  Administered 2018-05-08: 10 mg via INTRAVENOUS

## 2018-05-08 MED ORDER — MORPHINE SULFATE (PF) 4 MG/ML IV SOLN
4.0000 mg | Freq: Once | INTRAVENOUS | Status: AC
  Administered 2018-05-08: 4 mg via INTRAVENOUS
  Filled 2018-05-08: qty 1

## 2018-05-08 MED ORDER — PROPOFOL 10 MG/ML IV BOLUS
INTRAVENOUS | Status: DC | PRN
  Administered 2018-05-08: 110 mg via INTRAVENOUS

## 2018-05-08 MED ORDER — PROPOFOL 10 MG/ML IV BOLUS
0.5000 mg/kg | Freq: Once | INTRAVENOUS | Status: AC
  Administered 2018-05-08: 36.3 mg via INTRAVENOUS
  Filled 2018-05-08: qty 20

## 2018-05-08 MED ORDER — OXYCODONE-ACETAMINOPHEN 5-325 MG PO TABS: 1.0000 | ORAL_TABLET | ORAL | Status: DC | PRN

## 2018-05-08 MED ORDER — METHOCARBAMOL 500 MG PO TABS
ORAL_TABLET | ORAL | Status: AC
  Filled 2018-05-08: qty 1

## 2018-05-08 MED ORDER — METHOCARBAMOL 500 MG PO TABS: 500.0000 mg | ORAL_TABLET | Freq: Three times a day (TID) | ORAL | 0 refills | Status: DC | PRN

## 2018-05-08 MED ORDER — LACTATED RINGERS IV SOLN
INTRAVENOUS | Status: DC | PRN
  Administered 2018-05-08 (×2): via INTRAVENOUS

## 2018-05-08 MED ORDER — VANCOMYCIN HCL IN DEXTROSE 1-5 GM/200ML-% IV SOLN
INTRAVENOUS | Status: AC
  Filled 2018-05-08: qty 200

## 2018-05-08 MED ORDER — MIDAZOLAM HCL 2 MG/2ML IJ SOLN
INTRAMUSCULAR | Status: AC
  Filled 2018-05-08: qty 2

## 2018-05-08 MED ORDER — ROCURONIUM BROMIDE 10 MG/ML (PF) SYRINGE
PREFILLED_SYRINGE | INTRAVENOUS | Status: DC | PRN
  Administered 2018-05-08: 50 mg via INTRAVENOUS

## 2018-05-08 MED ORDER — ACETAMINOPHEN 500 MG PO TABS: 500.0000 mg | ORAL_TABLET | Freq: Three times a day (TID) | ORAL | 0 refills | Status: DC

## 2018-05-08 MED ORDER — LIDOCAINE 2% (20 MG/ML) 5 ML SYRINGE
INTRAMUSCULAR | Status: DC | PRN
  Administered 2018-05-08: 50 mg via INTRAVENOUS

## 2018-05-08 MED ORDER — HYDROCODONE-ACETAMINOPHEN 7.5-325 MG PO TABS: 1.0000 | ORAL_TABLET | Freq: Four times a day (QID) | ORAL | 0 refills | Status: DC | PRN

## 2018-05-08 MED ORDER — ONDANSETRON HCL 4 MG/2ML IJ SOLN
INTRAMUSCULAR | Status: DC | PRN
  Administered 2018-05-08: 4 mg via INTRAVENOUS

## 2018-05-08 MED ORDER — METHOCARBAMOL 500 MG PO TABS
500.0000 mg | ORAL_TABLET | Freq: Four times a day (QID) | ORAL | Status: DC | PRN
  Administered 2018-05-08: 500 mg via ORAL
  Filled 2018-05-08: qty 1

## 2018-05-08 MED ORDER — SUGAMMADEX SODIUM 200 MG/2ML IV SOLN
INTRAVENOUS | Status: DC | PRN
  Administered 2018-05-08: 150 mg via INTRAVENOUS

## 2018-05-08 MED ORDER — HYDROMORPHONE HCL 2 MG PO TABS
1.0000 mg | ORAL_TABLET | Freq: Once | ORAL | Status: AC
  Administered 2018-05-08: 1 mg via ORAL
  Filled 2018-05-08: qty 1

## 2018-05-08 MED ORDER — ENOXAPARIN SODIUM 40 MG/0.4ML ~~LOC~~ SOLN: 40.0000 mg | SUBCUTANEOUS | 0 refills | Status: DC

## 2018-05-08 MED ORDER — METHOCARBAMOL 1000 MG/10ML IJ SOLN
500.0000 mg | Freq: Four times a day (QID) | INTRAVENOUS | Status: DC | PRN
  Filled 2018-05-08: qty 5

## 2018-05-08 MED ORDER — FENTANYL CITRATE (PF) 100 MCG/2ML IJ SOLN
INTRAMUSCULAR | Status: DC | PRN
  Administered 2018-05-08 (×2): 50 ug via INTRAVENOUS

## 2018-05-08 SURGICAL SUPPLY — 58 items
BANDAGE ACE 4X5 VEL STRL LF (GAUZE/BANDAGES/DRESSINGS) ×3 IMPLANT
BANDAGE ACE 6X5 VEL STRL LF (GAUZE/BANDAGES/DRESSINGS) ×3 IMPLANT
BIT DRILL 2.4X140 LONG SOLID (BIT) ×3 IMPLANT
BIT DRILL CANNULTD 2.6 X 130MM (DRILL) ×1 IMPLANT
BIT DRILL SOLID 2.0 X 110MM (DRILL) ×1 IMPLANT
BLADE SURG 10 STRL SS (BLADE) ×3 IMPLANT
BNDG COHESIVE 6X5 TAN STRL LF (GAUZE/BANDAGES/DRESSINGS) ×3 IMPLANT
BNDG GAUZE ELAST 4 BULKY (GAUZE/BANDAGES/DRESSINGS) ×3 IMPLANT
BRUSH SCRUB SURG 4.25 DISP (MISCELLANEOUS) ×5 IMPLANT
COVER SURGICAL LIGHT HANDLE (MISCELLANEOUS) ×5 IMPLANT
CUFF TOURNIQUET SINGLE 18IN (TOURNIQUET CUFF) IMPLANT
DRAPE C-ARM 42X72 X-RAY (DRAPES) ×3 IMPLANT
DRAPE C-ARMOR (DRAPES) ×4 IMPLANT
DRAPE U-SHAPE 47X51 STRL (DRAPES) ×4 IMPLANT
DRILL CANNULATED 2.6 X 130MM (DRILL) ×4
DRILL SOLID 2.0 X 110MM (DRILL) ×4
DRSG MEPITEL 4X7.2 (GAUZE/BANDAGES/DRESSINGS) ×3 IMPLANT
ELECT CAUTERY BLADE 6.4 (BLADE) ×3 IMPLANT
ELECT REM PT RETURN 9FT ADLT (ELECTROSURGICAL) ×4
ELECTRODE REM PT RTRN 9FT ADLT (ELECTROSURGICAL) ×2 IMPLANT
GAUZE SPONGE 4X4 12PLY STRL (GAUZE/BANDAGES/DRESSINGS) ×3 IMPLANT
GLOVE BIO SURGEON STRL SZ7.5 (GLOVE) ×4 IMPLANT
GLOVE BIO SURGEON STRL SZ8 (GLOVE) ×4 IMPLANT
GLOVE BIOGEL PI IND STRL 7.5 (GLOVE) ×2 IMPLANT
GLOVE BIOGEL PI IND STRL 8 (GLOVE) ×2 IMPLANT
GLOVE BIOGEL PI INDICATOR 7.5 (GLOVE) ×2
GLOVE BIOGEL PI INDICATOR 8 (GLOVE) ×2
GOWN STRL REUS W/ TWL LRG LVL3 (GOWN DISPOSABLE) ×4 IMPLANT
GOWN STRL REUS W/ TWL XL LVL3 (GOWN DISPOSABLE) ×2 IMPLANT
GOWN STRL REUS W/TWL LRG LVL3 (GOWN DISPOSABLE) ×8
GOWN STRL REUS W/TWL XL LVL3 (GOWN DISPOSABLE) ×4
K-WIRE SNGL END 1.2X150 (MISCELLANEOUS) ×8
KIT BASIN OR (CUSTOM PROCEDURE TRAY) ×4 IMPLANT
KIT TURNOVER KIT B (KITS) ×4 IMPLANT
KWIRE SNGL END 1.2X150 (MISCELLANEOUS) ×2 IMPLANT
NS IRRIG 1000ML POUR BTL (IV SOLUTION) ×4 IMPLANT
PACK ORTHO EXTREMITY (CUSTOM PROCEDURE TRAY) ×4 IMPLANT
PAD ARMBOARD 7.5X6 YLW CONV (MISCELLANEOUS) ×8 IMPLANT
PAD CAST 4YDX4 CTTN HI CHSV (CAST SUPPLIES) ×1 IMPLANT
PADDING CAST COTTON 4X4 STRL (CAST SUPPLIES) ×4
PADDING CAST COTTON 6X4 STRL (CAST SUPPLIES) ×3 IMPLANT
PLATE FIBULA 9HOLE ANATOMICAL (Plate) ×3 IMPLANT
SCREW CANN 4.0X30 (Screw) ×3 IMPLANT
SCREW CANN SHT THR 4X34 (Screw) ×3 IMPLANT
SCREW LOCK PLATE R3 2.7X14 (Screw) ×3 IMPLANT
SCREW LOCK PLATE R3 2.7X18 (Screw) ×3 IMPLANT
SCREW LOCK PLATE R3 2.7X19 (Screw) ×6 IMPLANT
SCREW LOCK PLATE R3 3.5X14 (Screw) ×3 IMPLANT
SCREW NON LOCKING 3.5X12 (Screw) ×6 IMPLANT
SPLINT PLASTER CAST XFAST 5X30 (CAST SUPPLIES) ×1 IMPLANT
SPLINT PLASTER XFAST SET 5X30 (CAST SUPPLIES) ×2
STOCKINETTE IMPERVIOUS LG (DRAPES) ×3 IMPLANT
SUT ETHILON 3 0 PS 1 (SUTURE) ×3 IMPLANT
SUT VIC AB 2-0 CT1 27 (SUTURE) ×8
SUT VIC AB 2-0 CT1 TAPERPNT 27 (SUTURE) ×2 IMPLANT
TOWEL OR 17X24 6PK STRL BLUE (TOWEL DISPOSABLE) ×5 IMPLANT
TOWEL OR 17X26 10 PK STRL BLUE (TOWEL DISPOSABLE) ×5 IMPLANT
UNDERPAD 30X30 (UNDERPADS AND DIAPERS) ×4 IMPLANT

## 2018-05-08 NOTE — Discharge Instructions (Signed)
Orthopaedic Trauma Service Discharge Instructions   General Discharge Instructions  WEIGHT BEARING STATUS: Nonweightbearing Right leg   RANGE OF MOTION/ACTIVITY: do not remove splint/dressing. Ok to move toes and knee   Wound Care: do not remove splint/dressing. Ok to shower but keep splint/dressing dry   DVT/PE prophylaxis: Aspirin 325 mg daily x 4 weeks   Diet: as you were eating previously.  Can use over the counter stool softeners and bowel preparations, such as Miralax, to help with bowel movements.  Narcotics can be constipating.  Be sure to drink plenty of fluids  PAIN MEDICATION USE AND EXPECTATIONS  You have likely been given narcotic medications to help control your pain.  After a traumatic event that results in an fracture (broken bone) with or without surgery, it is ok to use narcotic pain medications to help control one's pain.  We understand that everyone responds to pain differently and each individual patient will be evaluated on a regular basis for the continued need for narcotic medications. Ideally, narcotic medication use should last no more than 6-8 weeks (coinciding with fracture healing).   As a patient it is your responsibility as well to monitor narcotic medication use and report the amount and frequency you use these medications when you come to your office visit.   We would also advise that if you are using narcotic medications, you should take a dose prior to therapy to maximize you participation.  IF YOU ARE ON NARCOTIC MEDICATIONS IT IS NOT PERMISSIBLE TO OPERATE A MOTOR VEHICLE (MOTORCYCLE/CAR/TRUCK/MOPED) OR HEAVY MACHINERY DO NOT MIX NARCOTICS WITH OTHER CNS (CENTRAL NERVOUS SYSTEM) DEPRESSANTS SUCH AS ALCOHOL   STOP SMOKING OR USING NICOTINE PRODUCTS!!!!  As discussed nicotine severely impairs your body's ability to heal surgical and traumatic wounds but also impairs bone healing.  Wounds and bone heal by forming microscopic blood vessels (angiogenesis) and  nicotine is a vasoconstrictor (essentially, shrinks blood vessels).  Therefore, if vasoconstriction occurs to these microscopic blood vessels they essentially disappear and are unable to deliver necessary nutrients to the healing tissue.  This is one modifiable factor that you can do to dramatically increase your chances of healing your injury.    (This means no smoking, no nicotine gum, patches, etc)  DO NOT USE NONSTEROIDAL ANTI-INFLAMMATORY DRUGS (NSAID'S)  Using products such as Advil (ibuprofen), Aleve (naproxen), Motrin (ibuprofen) for additional pain control during fracture healing can delay and/or prevent the healing response.  If you would like to take over the counter (OTC) medication, Tylenol (acetaminophen) is ok.  However, some narcotic medications that are given for pain control contain acetaminophen as well. Therefore, you should not exceed more than 4000 mg of tylenol in a day if you do not have liver disease.  Also note that there are may OTC medicines, such as cold medicines and allergy medicines that my contain tylenol as well.  If you have any questions about medications and/or interactions please ask your doctor/PA or your pharmacist.      ICE AND ELEVATE INJURED/OPERATIVE EXTREMITY  Using ice and elevating the injured extremity above your heart can help with swelling and pain control.  Icing in a pulsatile fashion, such as 20 minutes on and 20 minutes off, can be followed.    Do not place ice directly on skin. Make sure there is a barrier between to skin and the ice pack.    Using frozen items such as frozen peas works well as the conform nicely to the are that needs to be  iced.  USE AN ACE WRAP OR TED HOSE FOR SWELLING CONTROL  In addition to icing and elevation, Ace wraps or TED hose are used to help limit and resolve swelling.  It is recommended to use Ace wraps or TED hose until you are informed to stop.    When using Ace Wraps start the wrapping distally (farthest away from  the body) and wrap proximally (closer to the body)   Example: If you had surgery on your leg or thing and you do not have a splint on, start the ace wrap at the toes and work your way up to the thigh        If you had surgery on your upper extremity and do not have a splint on, start the ace wrap at your fingers and work your way up to the upper arm  IF YOU ARE IN A SPLINT OR CAST DO NOT [place]   If your splint gets wet for any reason please contact the office immediately. You may shower in your splint or cast as long as you keep it dry.  This can be done by wrapping in a cast cover or garbage back (or similar)  Do Not stick any thing down your splint or cast such as pencils, money, or hangers to try and scratch yourself with.  If you feel itchy take benadryl as prescribed on the bottle for itching  IF YOU ARE IN A CAM BOOT (BLACK BOOT)  You may remove boot periodically. Perform daily dressing changes as noted below.  Wash the liner of the boot regularly and wear a sock when wearing the boot. It is recommended that you sleep in the boot until told otherwise  CALL THE OFFICE WITH ANY QUESTIONS OR CONCERNS: [phone]

## 2018-05-08 NOTE — Anesthesia Preprocedure Evaluation (Signed)
Anesthesia Evaluation  Patient identified by MRN, date of birth, ID band Patient awake    Reviewed: Allergy & Precautions, NPO status , Patient's Chart, lab work & pertinent test results  History of Anesthesia Complications (+) PONV and history of anesthetic complications  Airway Mallampati: II  TM Distance: >3 FB Neck ROM: Full    Dental  (+) Teeth Intact, Dental Advisory Given   Pulmonary asthma , former smoker,    breath sounds clear to auscultation       Cardiovascular negative cardio ROS   Rhythm:Regular Rate:Normal     Neuro/Psych PSYCHIATRIC DISORDERS Anxiety negative neurological ROS     GI/Hepatic negative GI ROS, Neg liver ROS,   Endo/Other  Hypothyroidism   Renal/GU negative Renal ROS     Musculoskeletal negative musculoskeletal ROS (+)   Abdominal   Peds  Hematology negative hematology ROS (+)   Anesthesia Other Findings   Reproductive/Obstetrics                             Anesthesia Physical Anesthesia Plan  ASA: II  Anesthesia Plan: General   Post-op Pain Management:    Induction: Intravenous  PONV Risk Score and Plan: 4 or greater and Ondansetron and Dexamethasone  Airway Management Planned: LMA and Oral ETT  Additional Equipment: None  Intra-op Plan:   Post-operative Plan: Extubation in OR  Informed Consent: I have reviewed the patients History and Physical, chart, labs and discussed the procedure including the risks, benefits and alternatives for the proposed anesthesia with the patient or authorized representative who has indicated his/her understanding and acceptance.   Dental advisory given  Plan Discussed with: CRNA and Surgeon  Anesthesia Plan Comments:         Anesthesia Quick Evaluation

## 2018-05-08 NOTE — ED Notes (Signed)
Patient tolerated procedure well.

## 2018-05-08 NOTE — Progress Notes (Signed)
Orthopedic Tech Progress Note Patient Details:  Laura Landry August 09, 1955 [id number]  Ortho Devices Type of Ortho Device: Post (short leg) splint, Stirrup splint Ortho Device/Splint Location: rle Ortho Device/Splint Interventions: Ordered, Application, Adjustment   Post Interventions Patient Tolerated: Well Instructions Provided: Care of device, Adjustment of device   Karolee Stamps 05/08/2018, 2:40 AM

## 2018-05-08 NOTE — H&P (Signed)
[facility] H&P/Consult     Patient ID: Laura Landry MRN: [id number] DOB/AGE: August 24, 1955 62 y.o.  Chief Complaint: Right ankle fracture dislocation HPI: Laura Landry is an 62 y.o. female.who sustained a ground level fall resulting in external rotation posterior fracture dislocation. Dr. Roxanne Mins performed closed reduction but unable to maintain because of severe instability. Follow up films show persistent subluxation/ dislocation laterally though substantially improved from initial injury. Denies numbness but c/o severe, throbbing pain, 7-9/ 10. No other inj. In splint with ice and elevation. H/o breast cancer.  Past Medical History:  Diagnosis Date  . Anemia    history of as child  . Anxiety   . Asthma    seasonal  . Breast cancer of lower-inner quadrant of left female breast ([facility]) 03/09/2016  . Colitis   . Fatigue    very hard to wake up when fatigued  . Glaucoma    bilateral  . Hypothyroidism   . PONV (postoperative nausea and vomiting)     Past Surgical History:  Procedure Laterality Date  . BREAST ENHANCEMENT SURGERY Right 08/25/2016   Procedure: RIGHT BREAST AUGMENTATION FOR SYMMETRY;  Surgeon: Irene Limbo, MD;  Location: [facility];  Service: Plastics;  Laterality: Right;  . BREAST RECONSTRUCTION WITH PLACEMENT OF TISSUE EXPANDER AND FLEX HD (ACELLULAR HYDRATED DERMIS) Left 04/14/2016   Procedure: LEFT BREAST RECONSTRUCTION WITH PLACEMENT OF TISSUE EXPANDER;  Surgeon: Irene Limbo, MD;  Location: [facility];  Service: Plastics;  Laterality: Left;  . BREAST SURGERY Left 04/14/2016   mastectomy  . COLONOSCOPY W/ POLYPECTOMY    . EYE SURGERY Left    laser surgery due to high  pressure  . NIPPLE SPARING MASTECTOMY/SENTINAL LYMPH NODE BIOPSY/RECONSTRUCTION/PLACEMENT OF TISSUE EXPANDER Left 04/14/2016   Procedure: LEFT NIPPLE SPARING MASTECTOMY WITH LEFT AXILLARY SENTINEL LYMPH NODE BIOPSY;  Surgeon: Rolm Bookbinder, MD;  Location: [facility];   Service: General;  Laterality: Left;  . PERIPHERAL VASCULAR CATHETERIZATION Right 08/25/2016   Procedure: PORTA CATH REMOVAL;  Surgeon: Irene Limbo, MD;  Location: [facility];  Service: Plastics;  Laterality: Right;  . PORTACATH PLACEMENT N/A 05/07/2016   Procedure: INSERTION PORT-A-CATH WITH [place];  Surgeon: Rolm Bookbinder, MD;  Location: [facility];  Service: General;  Laterality: N/A;  . REMOVAL OF TISSUE EXPANDER AND PLACEMENT OF IMPLANT Left 08/25/2016   Procedure: REMOVAL OF LEFT TISSUE EXPANDER AND PLACEMENT OF SALINE IMPLANT;  Surgeon: Irene Limbo, MD;  Location: [facility];  Service: Plastics;  Laterality: Left;  . WISDOM TOOTH EXTRACTION      Family History  Problem Relation Age of Onset  . Colon cancer Paternal Grandmother   . Cancer Paternal Grandmother        colon  . Cancer Mother        breast  . Hypertension Father   . Asthma Father   . Diabetes Brother   . Ovarian cancer Paternal Aunt   . Ovarian cancer Maternal Aunt    Social History:  reports that she quit smoking about 5 years ago. She has never used smokeless tobacco. She reports that she drinks about 4.0 standard drinks of alcohol per week. She reports that she does not use drugs.  Allergies:  Allergies  Allergen Reactions  . Clindamycin/Lincomycin Swelling    THROAT SWELLS  . Eggs Or Egg-Derived Products Swelling and Other (See Comments)    ABDOMINAL PAIN SWELLING, SWELLING REACTION UNSPECIFIED   Abdominal pain ABDOMINAL PAIN SWELLING, SWELLING REACTION UNSPECIFIED    . Ciprofloxacin  Rash  . Codeine Hives and Swelling    SWELLING REACTION UNSPECIFIED Pt has tolerated Percocet   . Nickel Other (See Comments)    BLISTERS   . Penicillins Hives, Nausea And Vomiting, Swelling and Rash    SWELLING REACTION UNSPECIFIED  Has patient had PCN reaction causing immediate rash, facial/tongue/throat swelling, SOB or lightheadedness with hypotension: YES PCN reaction causing  severe rash involving mucus membranes or skin necrosis: NO PCN reaction that required hospitalization NO PCN reaction occurring within the last 10 years: NO If all of the above answers are "NO", then may proceed with Cephalosporin use.  . Sulfa Antibiotics Hives and Swelling    SWELLING REACTION UNSPECIFIED   . Sulfasalazine Hives and Swelling    SWELLING REACTION UNSPECIFIED  . Gadolinium Derivatives Hives    After Brain and Breast MRIs  . Tamoxifen Other (See Comments)    Hands swollen, felt like hot flashes through her body, did not tolerate at all  . Eszopiclone Palpitations  . Neulasta [Pegfilgrastim] Palpitations    Stabbing back pain, sob    Medications Prior to Admission  Medication Sig Dispense Refill  . albuterol (PROVENTIL HFA) 108 (90 Base) MCG/ACT inhaler Inhale 1-2 puffs into the lungs every 6 (six) hours as needed for wheezing. 1 Inhaler 0  . Calcium Carbonate-Vitamin D (CALCIUM-VITAMIN D) 500-200 MG-UNIT tablet Take 2 tablets by mouth daily.     . Cholecalciferol (VITAMIN D3) 5000 units CAPS Take 1 capsule by mouth daily.    Marland Kitchen ibuprofen (ADVIL,MOTRIN) 200 MG tablet Take 200 mg by mouth every 6 (six) hours as needed for mild pain.    Marland Kitchen letrozole (FEMARA) 2.5 MG tablet Take 2.5 mg by mouth daily.   3  . Multiple Vitamins-Minerals (ALIVE WOMENS 50+ PO) Take 1 tablet by mouth daily.     . Probiotic Product (DIGESTIVE ADVANTAGE PO) Take 3 tablets by mouth daily.     Marland Kitchen thyroid (ARMOUR) 90 MG tablet Take 90 mg by mouth daily.      Results for orders placed or performed during the hospital encounter of 05/07/18 (from the past 48 hour(s))  Protime-INR     Status: None   Collection Time: 05/08/18  4:46 AM  Result Value Ref Range   Prothrombin Time 13.5 11.4 - 15.2 seconds   INR 1.03     Comment: Performed at [facility] [address] [place], [place] [address]  APTT     Status: None   Collection Time: 05/08/18  4:46 AM  Result Value Ref Range   aPTT 26 24 - 36  seconds    Comment: Performed at [facility] [address] [place],  [address]  Basic metabolic panel     Status: Abnormal   Collection Time: 05/08/18  4:46 AM  Result Value Ref Range   Sodium 141 135 - 145 mmol/L   Potassium 4.4 3.5 - 5.1 mmol/L   Chloride 110 98 - 111 mmol/L   CO2 22 22 - 32 mmol/L   Glucose, Bld 113 (H) 70 - 99 mg/dL   BUN 6 (L) 8 - 23 mg/dL   Creatinine, Ser 0.74 0.44 - 1.00 mg/dL   Calcium 9.2 8.9 - 10.3 mg/dL   GFR calc non Af Amer >60 >60 mL/min   GFR calc Af Amer >60 >60 mL/min    Comment: (NOTE) The eGFR has been calculated using the CKD EPI equation. This calculation has not been validated in all clinical situations. eGFR's persistently <60  mL/min signify possible Chronic Kidney Disease.    Anion gap 9 5 - 15    Comment: Performed at [facility] [address], [place], [place] [address]  CBC with Differential     Status: Abnormal   Collection Time: 05/08/18  4:46 AM  Result Value Ref Range   WBC 12.4 (H) 4.0 - 10.5 K/uL   RBC 4.16 3.87 - 5.11 MIL/uL   Hemoglobin 13.4 12.0 - 15.0 g/dL   HCT 42.2 36.0 - 46.0 %   MCV 101.4 (H) 78.0 - 100.0 fL   MCH 32.2 26.0 - 34.0 pg   MCHC 31.8 30.0 - 36.0 g/dL   RDW 13.6 11.5 - 15.5 %   Platelets 253 150 - 400 K/uL   Neutrophils Relative % 82 %   Neutro Abs 10.3 (H) 1.7 - 7.7 K/uL   Lymphocytes Relative 11 %   Lymphs Abs 1.3 0.7 - 4.0 K/uL   Monocytes Relative 6 %   Monocytes Absolute 0.7 0.1 - 1.0 K/uL   Eosinophils Relative 0 %   Eosinophils Absolute 0.0 0.0 - 0.7 K/uL   Basophils Relative 1 %   Basophils Absolute 0.1 0.0 - 0.1 K/uL   Immature Granulocytes 0 %   Abs Immature Granulocytes 0.1 0.0 - 0.1 K/uL    Comment: Performed at [facility], [address] [place], [place] [address]   Dg Ankle 2 Views Right  Result Date: 05/08/2018 CLINICAL DATA:  Post reduction of ankle fracture EXAM: RIGHT ANKLE - 2 VIEW COMPARISON:  None. FINDINGS: The patient has comminuted  bilateral malleolar fractures with disruption of the ankle mortise. Alignment is similar since the original film. IMPRESSION: No significant change in the comminuted bilateral comminuted malleolar fractures with disruption of the ankle mortise. Electronically Signed   By: Dorise Bullion III M.D   On: 05/08/2018 02:38   Dg Ankle 2 Views Right  Result Date: 05/08/2018 CLINICAL DATA:  Trip and fall walking the dog with right ankle pain and deformity. EXAM: RIGHT ANKLE - 2 VIEW COMPARISON:  None. FINDINGS: Bimalleolar fracture with lateral displacement of the talus. Distal fibular fracture is comminuted, with apex medial angulation and at least 17 mm osseous overriding. Lateral dislocation of the talus. Transverse displaced fracture from the medial malleolus, the medial malleolar tip remains aligned with the displaced talus. Limited assessment for posterior tibial tubercle fracture given osseous overlap. Soft tissue edema. There is a plantar calcaneal spur. IMPRESSION: Displaced bimalleolar fracture with lateral dislocation of the talus. Medial malleolar and distal fibular fractures remain aligned with the displaced talus. Limited assessment of the posterior tibial malleolus due to osseous overlap related to fracture, which can be assessed on postreduction exam. Electronically Signed   By: Keith Rake M.D.   On: 05/08/2018 01:06   Dg Chest Port 1 View  Result Date: 05/08/2018 CLINICAL DATA:  Preop.  Ankle fracture. EXAM: PORTABLE CHEST 1 VIEW COMPARISON:  05/07/2016 FINDINGS: Previous right chest port has been removed.The cardiomediastinal contours are normal. The lungs are clear. Pulmonary vasculature is normal. No consolidation, pleural effusion, or pneumothorax. No acute osseous abnormalities are seen. IMPRESSION: No acute chest finding. Electronically Signed   By: Keith Rake M.D.   On: 05/08/2018 05:26    ROS  No recent fever, bleeding abnormalities, urologic dysfunction, GI problems, or  weight gain.  Blood pressure (!) 143/76, pulse 74, temperature 98.2 F (36.8 C), temperature source Oral, resp. rate 14, height 5' 3.75" (1.619 m), weight 72.6 kg,  SpO2 93 %. Physical Exam Apprehensive.  A&O x 4 NCAT RRR No retractions with breathing RLE Dressing intact, clean, dry  Edema/ swelling moderately severe within splint  Sens: DPN, SPN, TN intact  Motor: EHL, FHL, and lessor toe ext and flex all intact grossly  Brisk cap refill, warm to touch  Obvious deformity   Assessment/Plan Right ankle fracture dislocation without reduction, comminuted for external fixation and possible repair, anticipate two stage given considerable swelling present  I discussed with the patient and her son and daughter in law the risks and benefits of surgery, including the possibility of infection, nerve injury, vessel injury, wound breakdown, arthritis, symptomatic hardware, DVT/ PE, loss of motion, malunion, nonunion, and need for further surgery among others. We also specifically discussed the need to stage surgery because of the elevated risk of soft tissue breakdown that could lead to amputation. She acknowledged these risks and wished to proceed.  Altamese Athol, MD [facility], [facility] [phone]  05/08/2018, 11:19 AM

## 2018-05-08 NOTE — Progress Notes (Signed)
Orthopedic Tech Progress Note Patient Details:  Laura Landry [birth year] [id number]  Ortho Devices Type of Ortho Device: Crutches Ortho Device/Splint Location: rle Ortho Device/Splint Interventions: Application   Post Interventions Patient Tolerated: Well Instructions Provided: Care of device   [name] 05/08/2018, 5:45 PM

## 2018-05-08 NOTE — ED Notes (Signed)
Assisted patient with bedpan 

## 2018-05-08 NOTE — ED Provider Notes (Signed)
[facility] EMERGENCY DEPARTMENT Provider Note   CSN: [id number] Arrival date & time: 05/07/18  2359     History   Chief Complaint Chief Complaint  Patient presents with  . Ankle Deformity    HPI Laura Landry is a 62 y.o. female.  The history is provided by the patient.  She has history of hypothyroidism and asthma and comes in following a an injury to the right ankle.  She was walking her dog when she fell twisting her right ankle.  She heard something snap.  She has been unable to bear weight since then.  She denies other injury.  Pain is rated at 5/10 at rest, but goes up to 10/10 with any movement.  Past Medical History:  Diagnosis Date  . Anemia    history of as child  . Anxiety   . Asthma    seasonal  . Breast cancer of lower-inner quadrant of left female breast ([place]) 03/09/2016  . Colitis   . Fatigue    very hard to wake up when fatigued  . Glaucoma    bilateral  . Hypothyroidism   . PONV (postoperative nausea and vomiting)     Patient Active Problem List   Diagnosis Date Noted  . Lung nodule-  new spot followed by ONC docs 03/07/2018  . 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, followed [facility] at [facility] 03/07/2018  . Anxiety 05/11/2016  . Fatigue 05/11/2016  . Environmental and seasonal allergies 05/11/2016  . 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 Breast cancer ([place]) 04/14/2016  . Carcinoma of lower-inner quadrant of left breast ([place]) 03/30/2016  . Breast cancer of lower-inner quadrant of left female breast ([place]) 03/09/2016  . Former smoker 02/23/2016  . White coat syndrome without diagnosis of hypertension 02/23/2016  . Prehypertension 01/09/2016  . Hypothyroidism 01/08/2016  . Rhus dermatitis 01/07/2016    Past Surgical History:  Procedure Laterality Date  . BREAST ENHANCEMENT SURGERY Right 08/25/2016   Procedure: RIGHT BREAST AUGMENTATION FOR SYMMETRY;  Surgeon: Irene Limbo, MD;  Location:  [place];  Service: Plastics;  Laterality: Right;  . BREAST RECONSTRUCTION WITH PLACEMENT OF TISSUE EXPANDER AND FLEX HD (ACELLULAR HYDRATED DERMIS) Left 04/14/2016   Procedure: LEFT BREAST RECONSTRUCTION WITH PLACEMENT OF TISSUE EXPANDER;  Surgeon: Irene Limbo, MD;  Location: [place];  Service: Plastics;  Laterality: Left;  . BREAST SURGERY Left 04/14/2016   mastectomy  . COLONOSCOPY W/ POLYPECTOMY    . EYE SURGERY Left    laser surgery due to high  pressure  . NIPPLE SPARING MASTECTOMY/SENTINAL LYMPH NODE BIOPSY/RECONSTRUCTION/PLACEMENT OF TISSUE EXPANDER Left 04/14/2016   Procedure: LEFT NIPPLE SPARING MASTECTOMY WITH LEFT AXILLARY SENTINEL LYMPH NODE BIOPSY;  Surgeon: Rolm Bookbinder, MD;  Location: [place];  Service: General;  Laterality: Left;  . PERIPHERAL VASCULAR CATHETERIZATION Right 08/25/2016   Procedure: PORTA CATH REMOVAL;  Surgeon: Irene Limbo, MD;  Location: [facility];  Service: Plastics;  Laterality: Right;  . PORTACATH PLACEMENT N/A 05/07/2016   Procedure: INSERTION PORT-A-CATH WITH [place];  Surgeon: Rolm Bookbinder, MD;  Location: [place];  Service: General;  Laterality: N/A;  . REMOVAL OF TISSUE EXPANDER AND PLACEMENT OF IMPLANT Left 08/25/2016   Procedure: REMOVAL OF LEFT TISSUE EXPANDER AND PLACEMENT OF SALINE IMPLANT;  Surgeon: Irene Limbo, MD;  Location: [place];  Service: Plastics;  Laterality: Left;  . WISDOM TOOTH EXTRACTION       OB History   None  Home Medications    Prior to Admission medications   Medication Sig Start Date End Date Taking? Authorizing Provider  albuterol (PROVENTIL HFA) 108 (90 Base) MCG/ACT inhaler Inhale 1-2 puffs into the lungs every 6 (six) hours as needed for wheezing. 03/16/18   Opalski, Deborah, DO  Calcium Carbonate-Vitamin D (CALCIUM-VITAMIN D) 500-200 MG-UNIT tablet Take 2 tablets by mouth daily.     [provider]  Cholecalciferol (VITAMIN D3) 5000 units CAPS  Take 1 capsule by mouth daily.    [provider]  letrozole (FEMARA) 2.5 MG tablet Take 1 tablet by mouth daily. 12/03/17   [provider]  Multiple Vitamins-Minerals (ALIVE WOMENS 50+ PO) Take 1 tablet by mouth daily.     [provider]  naphazoline-pheniramine (NAPHCON-A) 0.025-0.3 % ophthalmic solution Place 1 drop into both eyes 4 (four) times daily as needed for irritation or allergies.    [provider]  Probiotic Product (DIGESTIVE ADVANTAGE PO) Take 1 tablet by mouth 3 (three) times daily.    [provider]  SALINE NASAL MIST NA Place 1 spray into the nose daily as needed (nasal congestion).    [provider]  thyroid (ARMOUR) 90 MG tablet Take 90 mg by mouth daily.    [provider]  Wheat Dextrin (BENEFIBER PO) Take 3 tablets by mouth daily.    [provider]    Family History Family History  Problem Relation Age of Onset  . Colon cancer Paternal Grandmother   . Cancer Paternal Grandmother        colon  . Cancer Mother        breast  . Hypertension Father   . Asthma Father   . Diabetes Brother   . Ovarian cancer Paternal Aunt   . Ovarian cancer Maternal Aunt     Social History Social History   Tobacco Use  . Smoking status: Former Smoker    Last attempt to quit: 08/03/2012    Years since quitting: 5.7  . Smokeless tobacco: Never Used  Substance Use Topics  . Alcohol use: Yes    Alcohol/week: 4.0 standard drinks    Types: 4 Glasses of wine per week  . Drug use: No     Allergies   Clindamycin/lincomycin; Eggs or egg-derived products; Ciprofloxacin; Codeine; Nickel; Penicillins; Sulfa antibiotics; Sulfasalazine; Gadolinium derivatives; Tamoxifen; Eszopiclone; and Neulasta [pegfilgrastim]   Review of Systems Review of Systems  All other systems reviewed and are negative.    Physical Exam Updated Vital Signs BP 119/75 (BP Location: Right Arm)   Pulse 81   Temp 98.2 F (36.8 C)  (Oral)   Resp 18   Ht 5' 3.75" (1.619 m)   Wt 72.6 kg   SpO2 97%   BMI 27.68 kg/m   Physical Exam  Nursing note and vitals reviewed.  62 year old female, resting comfortably and in no acute distress. Vital signs are normal. Oxygen saturation is 97%, which is normal. Head is normocephalic and atraumatic. PERRLA, EOMI. Oropharynx is clear. Neck is nontender and supple without adenopathy or JVD. Back is nontender and there is no CVA tenderness. Lungs are clear without rales, wheezes, or rhonchi. Chest is nontender. Heart has regular rate and rhythm without murmur. Abdomen is soft, flat, nontender without masses or hepatosplenomegaly and peristalsis is normoactive. Extremities: Deformity of the right ankle noted with the foot rotated externally compared with the lower leg.  There are minor abrasions over the medial aspect of the right ankle but no break in  the skin.  There is some tenting of the skin.  Dorsalis pedis pulses strong and sensation is normal.  Capillary refill is prompt.. Skin is warm and dry without rash. Neurologic: Mental status is normal, cranial nerves are intact, there are no motor or sensory deficits.  ED Treatments / Results  Labs (all labs ordered are listed, but only abnormal results are displayed) Labs Reviewed  BASIC METABOLIC PANEL - Abnormal; Notable for the following components:      Result Value   Glucose, Bld 113 (*)    BUN 6 (*)    All other components within normal limits  CBC WITH DIFFERENTIAL/PLATELET - Abnormal; Notable for the following components:   WBC 12.4 (*)    MCV 101.4 (*)    Neutro Abs 10.3 (*)    All other components within normal limits  PROTIME-INR  APTT    EKG EKG Interpretation  Date/Time:  Sunday May 08 2018 04:58:17 EDT Ventricular Rate:  92 PR Interval:    QRS Duration: 101 QT Interval:  379 QTC Calculation: 469 R Axis:   74 Text Interpretation:  Sinus rhythm Borderline short PR interval No old tracing to compare  Confirmed by Delora Fuel ([id number]) on 05/08/2018 5:11:30 AM   Radiology Dg Ankle 2 Views Right  Result Date: 05/08/2018 CLINICAL DATA:  Post reduction of ankle fracture EXAM: RIGHT ANKLE - 2 VIEW COMPARISON:  None. FINDINGS: The patient has comminuted bilateral malleolar fractures with disruption of the ankle mortise. Alignment is similar since the original film. IMPRESSION: No significant change in the comminuted bilateral comminuted malleolar fractures with disruption of the ankle mortise. Electronically Signed   By: Dorise Bullion III M.D   On: 05/08/2018 02:38   Dg Ankle 2 Views Right  Result Date: 05/08/2018 CLINICAL DATA:  Trip and fall walking the dog with right ankle pain and deformity. EXAM: RIGHT ANKLE - 2 VIEW COMPARISON:  None. FINDINGS: Bimalleolar fracture with lateral displacement of the talus. Distal fibular fracture is comminuted, with apex medial angulation and at least 17 mm osseous overriding. Lateral dislocation of the talus. Transverse displaced fracture from the medial malleolus, the medial malleolar tip remains aligned with the displaced talus. Limited assessment for posterior tibial tubercle fracture given osseous overlap. Soft tissue edema. There is a plantar calcaneal spur. IMPRESSION: Displaced bimalleolar fracture with lateral dislocation of the talus. Medial malleolar and distal fibular fractures remain aligned with the displaced talus. Limited assessment of the posterior tibial malleolus due to osseous overlap related to fracture, which can be assessed on postreduction exam. Electronically Signed   By: Keith Rake M.D.   On: 05/08/2018 01:06   Dg Chest Port 1 View  Result Date: 05/08/2018 CLINICAL DATA:  Preop.  Ankle fracture. EXAM: PORTABLE CHEST 1 VIEW COMPARISON:  05/07/2016 FINDINGS: Previous right chest port has been removed.The cardiomediastinal contours are normal. The lungs are clear. Pulmonary vasculature is normal. No consolidation, pleural effusion, or  pneumothorax. No acute osseous abnormalities are seen. IMPRESSION: No acute chest finding. Electronically Signed   By: Keith Rake M.D.   On: 05/08/2018 05:26    Procedures .Sedation Date/Time: 05/08/2018 1:50 AM Performed by: Delora Fuel, MD Authorized by: Delora Fuel, MD   Consent:    Consent obtained:  Verbal   Consent given by:  Patient   Risks discussed:  Allergic reaction, dysrhythmia, inadequate sedation, nausea, prolonged hypoxia resulting in organ damage, prolonged sedation necessitating reversal, respiratory compromise necessitating ventilatory assistance and intubation and vomiting   Alternatives discussed:  Analgesia without sedation, anxiolysis and regional anesthesia Universal protocol:    Procedure explained and questions answered to patient or proxy's satisfaction: yes     Relevant documents present and verified: yes     Test results available and properly labeled: yes     Imaging studies available: yes     Required blood products, implants, devices, and special equipment available: yes     Site/side marked: yes     Immediately prior to procedure a time out was called: yes     Patient identity confirmation method:  Verbally with patient Indications:    Procedure necessitating sedation performed by:  Physician performing sedation Pre-sedation assessment:    Time since last food or drink:  Four hours   ASA classification: class 1 - normal, healthy patient     Neck mobility: normal     Mouth opening:  3 or more finger widths   Thyromental distance:  4 finger widths   Mallampati score:  I - soft palate, uvula, fauces, pillars visible   Pre-sedation assessments completed and reviewed: airway patency, cardiovascular function, hydration status, mental status, nausea/vomiting, pain level, respiratory function and temperature     Pre-sedation assessment completed:  05/08/2018 1:50 AM Immediate pre-procedure details:    Reassessment: Patient reassessed immediately prior  to procedure     Reviewed: vital signs, relevant labs/tests and NPO status     Verified: bag valve mask available, emergency equipment available, intubation equipment available, IV patency confirmed, oxygen available and suction available   Procedure details (see MAR for exact dosages):    Preoxygenation:  Nasal cannula   Sedation:  Propofol   Intra-procedure monitoring:  Blood pressure monitoring, cardiac monitor, continuous pulse oximetry, frequent LOC assessments, frequent vital sign checks and continuous capnometry   Intra-procedure events: none     Total Provider sedation time (minutes):  30 Post-procedure details:    Post-sedation assessment completed:  05/08/2018 2:20 AM   Attendance: Constant attendance by certified staff until patient recovered     Recovery: Patient returned to pre-procedure baseline     Post-sedation assessments completed and reviewed: airway patency, cardiovascular function, hydration status, mental status, nausea/vomiting, pain level, respiratory function and temperature     Patient is stable for discharge or admission: yes     Patient tolerance:  Tolerated well, no immediate complications Reduction of fracture Date/Time: 05/08/2018 2:14 AM Performed by: Delora Fuel, MD Authorized by: Delora Fuel, MD  Consent: Written consent obtained. Risks and benefits: risks, benefits and alternatives were discussed Consent given by: patient Patient understanding: patient states understanding of the procedure being performed Patient consent: the patient's understanding of the procedure matches consent given Procedure consent: procedure consent matches procedure scheduled Relevant documents: relevant documents present and verified Test results: test results available and properly labeled Site marked: the operative site was marked Imaging studies: imaging studies available Required items: required blood products, implants, devices, and special equipment available Patient  identity confirmed: verbally with patient and arm band Time out: Immediately prior to procedure a "time out" was called to verify the correct patient, procedure, equipment, support staff and site/side marked as required. Local anesthesia used: no  Anesthesia: Local anesthesia used: no  Sedation: Patient sedated: yes Sedation type: moderate (conscious) sedation Sedatives: propofol Analgesia: fentanyl Sedation start date/time: 05/08/2018 1:50 AM Sedation end date/time: 05/08/2018 2:20 AM Vitals: Vital signs were monitored during sedation.  Patient tolerance: Patient tolerated the procedure well with no immediate complications  .Splint Application Date/Time: 52/02/7823 2:16 AM Performed by: Roxanne Mins,  Shanon Brow, MD Authorized by: Delora Fuel, MD   Consent:    Consent obtained:  Verbal   Consent given by:  Patient   Risks discussed:  Pain and swelling   Alternatives discussed:  No treatment Pre-procedure details:    Sensation:  Normal   Skin color:  Normal Procedure details:    Laterality:  Right   Location:  Ankle   Ankle:  R ankle   Strapping: no     Splint type:  Ankle stirrup (plus posterior)   Supplies:  Cotton padding, Ortho-Glass and elastic bandage Post-procedure details:    Pain:  Improved   Sensation:  Normal   Skin color:  Normal   Patient tolerance of procedure:  Tolerated well, no immediate complications    Medications Ordered in ED Medications  fentaNYL (SUBLIMAZE) injection 100 mcg (has no administration in time range)     Initial Impression / Assessment and Plan / ED Course  I have reviewed the triage vital signs and the nursing notes.  Pertinent labs & imaging results that were available during my care of the patient were reviewed by me and considered in my medical decision making (see chart for details).  Right ankle injury.  She will be sent for x-rays.  She is given fentanyl for pain.  X-rays confirm fracture dislocation.  Patient is given procedural  sedation, ankle is reduced and splint applied.  Postreduction x-rays show improvement in position, but still not anatomically aligned.  Patient continues to have pain and is requiring additional doses of intravenous narcotics.  Case is discussed with Dr. Ginette Pitman, on-call for orthopedics, who has reviewed her x-rays and agrees that current position is not acceptable and agrees to admit the patient for operative management later today.  Of note, while being observed in the ED, waiting for callback from orthopedics, ankle has become progressively more deformed with external rotation of the foot in spite of splint being in place.  Final Clinical Impressions(s) / ED Diagnoses   Final diagnoses:  Closed fracture dislocation of right ankle, initial encounter    ED Discharge Orders    None       Delora Fuel, MD 27/25/36 [phone]

## 2018-05-08 NOTE — ED Triage Notes (Signed)
Pt BIB [facility] for eval of obvious R ankle deformity. Pt was going down some stairs, tripped on her slipper and now has R ankle eversion. Per EMS, intact CMS to R foot. Splinted w/ pillow, pt received 100 mcg of fentanyl by EMS @2256 . Pt reports 5/10 pain. Foot is warm and well perfused on arrival.

## 2018-05-08 NOTE — ED Notes (Signed)
Patient transported to X-ray 

## 2018-05-08 NOTE — Brief Op Note (Signed)
05/08/2018  5:26 PM  PATIENT:  Laura Landry  62 y.o. female  PRE-OPERATIVE DIAGNOSIS:  RIGHT ANKLE FRACTURE DISLOCATION  POST-OPERATIVE DIAGNOSIS:  RIGHT ANKLE FRACTURE DISLOCATION  PROCEDURE:  Procedure(s): 1. ORIF OF RIGHT ANKLE BIMALLEOLAR FRACTURE (Right)  2. OPEN REDUCTION OF RIGHT ANKLE DISLOCATION 3. STRESS FLUOROSCOPY OF THE SYNDESMOSIS  SURGEON:  Surgeon(s) and Role:    Altamese F[place], MD - Primary  ASSISTANTS: none   ANESTHESIA:   regional and general  EBL:  70 mL   BLOOD ADMINISTERED:none  DRAINS: none   LOCAL MEDICATIONS USED:  NONE  SPECIMEN:  No Specimen  DISPOSITION OF SPECIMEN:  N/A  COUNTS:  YES  TOURNIQUET:  * No tourniquets in log *  DICTATION: .Other Dictation: Dictation Number [id number]  PLAN OF CARE: Discharge to home after PACU  PATIENT DISPOSITION:  PACU - hemodynamically stable.   Delay start of Pharmacological VTE agent (>24hrs) due to surgical blood loss or risk of bleeding: no

## 2018-05-08 NOTE — Anesthesia Procedure Notes (Signed)
Procedure Name: Intubation Date/Time: 05/08/2018 11:31 AM Performed by: Leonor Liv, CRNA Pre-anesthesia Checklist: Patient identified, Emergency Drugs available, Suction available and Patient being monitored Patient Re-evaluated:Patient Re-evaluated prior to induction Oxygen Delivery Method: Circle System Utilized Preoxygenation: Pre-oxygenation with 100% oxygen Induction Type: IV induction Ventilation: Mask ventilation without difficulty Laryngoscope Size: Mac and 3 Grade View: Grade I Tube type: Oral Tube size: 7.0 mm Number of attempts: 1 Airway Equipment and Method: Stylet and Oral airway Placement Confirmation: ETT inserted through vocal cords under direct vision,  positive ETCO2 and breath sounds checked- equal and bilateral Secured at: 21 cm Tube secured with: Tape Dental Injury: Teeth and Oropharynx as per pre-operative assessment

## 2018-05-09 ENCOUNTER — Encounter (HOSPITAL_COMMUNITY): Payer: Self-pay | Admitting: Orthopedic Surgery

## 2018-05-09 MED ORDER — ROPIVACAINE HCL 7.5 MG/ML IJ SOLN
INTRAMUSCULAR | Status: DC | PRN
  Administered 2018-05-08: 12 mL via PERINEURAL

## 2018-05-09 MED ORDER — BUPIVACAINE-EPINEPHRINE (PF) 0.5% -1:200000 IJ SOLN
INTRAMUSCULAR | Status: DC | PRN
  Administered 2018-05-08: 30 mL via PERINEURAL

## 2018-05-09 MED ORDER — BUPIVACAINE-EPINEPHRINE (PF) 0.5% -1:200000 IJ SOLN: INTRAMUSCULAR | Status: DC | PRN

## 2018-05-09 NOTE — Anesthesia Postprocedure Evaluation (Signed)
Anesthesia Post Note  Patient: Laura Landry  Procedure(s) Performed: RIGHT ANKLE FRACTURE REPAIR (Right Ankle)     Patient location during evaluation: PACU Anesthesia Type: Regional and General Level of consciousness: awake and alert Pain management: pain level controlled Vital Signs Assessment: post-procedure vital signs reviewed and stable Respiratory status: spontaneous breathing, nonlabored ventilation, respiratory function stable and patient connected to nasal cannula oxygen Cardiovascular status: blood pressure returned to baseline and stable Postop Assessment: no apparent nausea or vomiting Anesthetic complications: no    Last Vitals:  Vitals:   05/08/18 1610 05/08/18 1640  BP: 126/76 95/71  Pulse: 77 72  Resp: 13 13  Temp:    SpO2: 97% 96%    Last Pain:  Vitals:   05/08/18 1655  TempSrc:   PainSc: 0-No pain                 Mclain Freer

## 2018-05-09 NOTE — Transfer of Care (Signed)
Immediate Anesthesia Transfer of Care Note  Patient: Laura Landry  Procedure(s) Performed: RIGHT ANKLE FRACTURE REPAIR (Right Ankle)  Patient Location: PACU  Anesthesia Type:General and Regional  Level of Consciousness: awake and alert   Airway & Oxygen Therapy: Patient Spontanous Breathing and Patient connected to nasal cannula oxygen  Post-op Assessment: Report given to RN and Post -op Vital signs reviewed and stable  Post vital signs: Reviewed and stable  Last Vitals:  Vitals Value Taken Time  BP    Temp    Pulse    Resp    SpO2      Last Pain:  Vitals:   05/08/18 1655  TempSrc:   PainSc: 0-No pain         Complications: No apparent anesthesia complications

## 2018-05-09 NOTE — Anesthesia Procedure Notes (Signed)
Anesthesia Regional Block: Adductor canal block   Pre-Anesthetic Checklist: ,, timeout performed, Correct Patient, Correct Site, Correct Laterality, Correct Procedure, Correct Position, site marked, Risks and benefits discussed,  Surgical consent,  Pre-op evaluation,  At surgeon's request and post-op pain management  Laterality: Right  Prep: chloraprep       Needles:  Injection technique: Single-shot  Needle Type: Echogenic Stimulator Needle          Additional Needles:   Procedures:,,,, ultrasound used (permanent image in chart),,,,  Narrative:  Start time: 05/08/2018 11:04 AM End time: 05/09/2018 11:10 AM  Performed by: Personally  Anesthesiologist: Oleta Mouse, MD

## 2018-05-09 NOTE — Anesthesia Postprocedure Evaluation (Signed)
Anesthesia Post Note  Patient: Laura Landry  Procedure(s) Performed: RIGHT ANKLE FRACTURE REPAIR (Right Ankle)     Patient location during evaluation: PACU Anesthesia Type: Regional and General Level of consciousness: awake and alert Pain management: pain level controlled Vital Signs Assessment: post-procedure vital signs reviewed and stable Respiratory status: spontaneous breathing, nonlabored ventilation, respiratory function stable and patient connected to nasal cannula oxygen Cardiovascular status: blood pressure returned to baseline and stable Postop Assessment: no apparent nausea or vomiting Anesthetic complications: no    Last Vitals:  Vitals:   05/08/18 1610 05/08/18 1640  BP: 126/76 95/71  Pulse: 77 72  Resp: 13 13  Temp:    SpO2: 97% 96%    Last Pain:  Vitals:   05/08/18 1655  TempSrc:   PainSc: 0-No pain                 Shaneal Barasch

## 2018-05-09 NOTE — Anesthesia Procedure Notes (Signed)
Anesthesia Regional Block: Popliteal block   Pre-Anesthetic Checklist: ,, timeout performed, Correct Patient, Correct Site, Correct Laterality, Correct Procedure, Correct Position, site marked, Risks and benefits discussed,  Surgical consent,  Pre-op evaluation,  At surgeon's request and post-op pain management  Laterality: Right  Prep: chloraprep       Needles:  Injection technique: Single-shot  Needle Type: Echogenic Stimulator Needle          Additional Needles:   Procedures:,,,, ultrasound used (permanent image in chart),,,,  Narrative:  Start time: 05/08/2018 11:10 AM End time: 05/08/2018 11:19 AM  Performed by: Personally  Anesthesiologist: Oleta Mouse, MD

## 2018-05-15 NOTE — Op Note (Signed)
Laura Landry, BUTSCH MEDICAL RECORD ST:[id number] ACCOUNT [id number] DATE OF BIRTH:08-17-1955 FACILITY: [facility] LOCATION: [facility] PHYSICIAN:Adely Facer H. Aerik Polan, MD  OPERATIVE REPORT  DATE OF PROCEDURE:  05/08/2018  PREOPERATIVE DIAGNOSES: 1.  Right ankle dislocation. 2.  Comminuted right bimalleolar ankle fracture. 3.  Fall. 4.  Breast cancer history.  POSTOPERATIVE DIAGNOSES:   1.  Right ankle dislocation. 2.  Comminuted right bimalleolar ankle fracture. 3.  Fall. 4.  Breast cancer history.  PROCEDURES: 1.  Open reduction of right ankle dislocation. 2.  Open reduction internal fixation of right ankle bimalleolar fracture. 3.  Stress fluoroscopy of the syndesmosis.  SURGEON:  Altamese Wellton, MD  ASSISTANT:  None.  ANESTHESIA:  Regional and general.  ESTIMATED BLOOD LOSS:  70 mL.  DISPOSITION:  To PACU.  CONDITION:  Stable.  BRIEF SUMMARY AND INDICATIONS FOR PROCEDURE:  The patient is a very pleasant 62 year old female with a past medical history notable for breast cancer, who sustained a ground-level fall resulting in a severely unstable right ankle fracture dislocation  with comminution.  The patient was seen and evaluated by Dr. Delora Fuel, who attempted a closed reduction of the ankle dislocation but was simply unable to maintain it despite the attempts.  She remained in a splint and immediately to the OR to reduce  the pressure on the skin, restore a concentric reduction of the ankle, and possibly proceed with internal fixation of the bone, so we anticipated ____ her husband, son and daughter-in-law the risks and benefits of the procedure including the possibility  of infection, particularly if we had to operate through threatened skin from the dislocation, DVT, PE given the cancer history, arthritis, loss of motion, malunion, nonunion, symptomatic hardware and need for further surgery, among others.  Because of  the low nature of this fracture at the ankle joint as opposed  to within the tibial shaft, I did confer with my anesthesia colleague, and we decided to proceed with ankle block for improved pain control as the patient was hopeful to maintain the ability  to go home later this evening in the absence of any skin.  BRIEF SUMMARY OF PROCEDURE:  The patient was taken to the operating room where general anesthesia was induced.  The splint was removed revealing a markedly deformed ankle.  There were small areas of blistering along the medial side, which was not  unanticipated.  With careful retraction and chlorhexidine wash, and then Betadine scrub and paint were performed.  No tourniquet was used during the procedure.  The leg was positioned on a bone foam and draped appropriately.  Timeout was held.  We began  with a lateral incision and carefully dissected down to the fibula, leaving intact what remained of the periosteum.  There was marked comminution here, and from the injury film, the talus had translated a full 100% out from underneath the tibia, leaving  a multitude of small fragments in the joint by pulling a comminuted medial malleolus into it.  I was able to distract the fracture site and remove these fragments to prevent a concentric reduction of the ankle joint, irrigating it thoroughly.  The talus  seemed surprisingly well preserved without full-thickness lesion.  There were some small chondral scrapes.  I was then able to directly reduce the talus underneath the tibia through this open incision, though I was unable to adequately, of course, gauge  the outer cortex reduction of the medial malleolus.  At this point, having accomplished open reduction of the ankle joint, we turned  our attention to the lateral component of the bimalleolar fracture.  I used the Paragon 28 system and pulled a distraction and a reduction, securing fixation in the distal fragment and then using pointed tenaculums to reduce these segments.  Of note, the medial aspect of the tibia  within the incisura had been rotated 90  degrees and was pushing directly out on the skin, creating the potential for skin breakdown from pressure from underneath.  This required an open incision and reduction.  Once this was placed back in the incisura and I had obtained the provisional  reduction with clamps, I then applied the plate using a fixation with 2.7 screws distally and 3.5 screws proximally.  This appeared to produce an anatomic alignment of the fibula on all 3 views.  I turned attention to the medial side.  Here, there were  again the small blisters which were slightly anterior to where I might make an incision but very close to it.  There was spreading ecchymosis and contusion within the zone where I would need to make an open incision.  There was marked comminution of the  medial side as well.  As a result, I chose to place 2 cannulated screws to try to obtain apposition without the same degree of skin risk.  I carefully evaluated these on multiple views and placed 2 screws, securing the threads across the fracture site.   I was careful not to over reduce this or produce any further displacement than was already present medially.  I then brought in the C-arm and performed an external rotation stress view under live fluoro.  Syndesmosis integrity had been restored by  reduction of the fibula, and I did not identify any gapping or translation.  The wounds were irrigated thoroughly, closed in standard layered fashion and a posterior and stirrup splint applied.  The patient was awakened from anesthesia and transported to  the PACU in stable condition.  PROGNOSIS:  The patient will be nonweightbearing on ____ axis.  I will plan to see her back in the office in 2 weeks for conversion into a cast given the comminution.  I did place Mepitel and gauze over the blistered areas.  She is at elevated risk for  DVT and PE as noted above, in addition to arthritis and a delayed or nonunion given the severe  comminution.  Metabolic bone work will also be included in her followup.  LN/NUANCE  D:05/15/2018 T:05/15/2018 JOB:[id number]

## 2018-07-17 DIAGNOSIS — C50912 Malignant neoplasm of unspecified site of left female breast: Secondary | ICD-10-CM | POA: Insufficient documentation

## 2018-07-17 DIAGNOSIS — C7951 Secondary malignant neoplasm of bone: Secondary | ICD-10-CM

## 2018-08-29 ENCOUNTER — Encounter: Payer: Self-pay | Admitting: Family Medicine

## 2018-08-29 ENCOUNTER — Ambulatory Visit (INDEPENDENT_AMBULATORY_CARE_PROVIDER_SITE_OTHER): Payer: BLUE CROSS/BLUE SHIELD | Admitting: Family Medicine

## 2018-08-29 ENCOUNTER — Other Ambulatory Visit (HOSPITAL_COMMUNITY)
Admission: RE | Admit: 2018-08-29 | Discharge: 2018-08-29 | Disposition: A | Discharge status: Home / Self Care | Payer: BLUE CROSS/BLUE SHIELD | Source: Ambulatory Visit | Attending: Family Medicine | Admitting: Family Medicine

## 2018-08-29 VITALS — BP 128/78 | HR 90 | Temp 98.0°F | Ht 65.0 in | Wt 152.9 lb

## 2018-08-29 DIAGNOSIS — C50312 Malignant neoplasm of lower-inner quadrant of left female breast: Secondary | ICD-10-CM | POA: Diagnosis not present

## 2018-08-29 DIAGNOSIS — C7951 Secondary malignant neoplasm of bone: Secondary | ICD-10-CM

## 2018-08-29 DIAGNOSIS — Z170 Estrogen receptor positive status [ER+]: Secondary | ICD-10-CM

## 2018-08-29 DIAGNOSIS — Z124 Encounter for screening for malignant neoplasm of cervix: Secondary | ICD-10-CM | POA: Diagnosis present

## 2018-08-29 DIAGNOSIS — Z0000 Encounter for general adult medical examination without abnormal findings: Secondary | ICD-10-CM

## 2018-08-29 DIAGNOSIS — C50912 Malignant neoplasm of unspecified site of left female breast: Secondary | ICD-10-CM

## 2018-08-29 DIAGNOSIS — R911 Solitary pulmonary nodule: Secondary | ICD-10-CM

## 2018-08-29 NOTE — Patient Instructions (Signed)
I recommend as part of your yearly routine screening lab work, that when you get blood work in 2 weeks with Dr. Tamera Stands office at [facility], that they add on a fasting cholesterol panel in addition to what ever they will be ordering.  If you cannot be fasting at the time they will need to order a direct LDL.  Thank you.

## 2018-08-29 NOTE — Progress Notes (Signed)
Impression and Recommendations:    1. Papanicolaou smear for cervical cancer screening   2. Screening for cervical cancer   3. Normal female breast exam   4. 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 Breast cancer metastasized to bone, left ([place])-  stage 4   6. Lung nodule-  new spot followed by ONC docs     - Extensively discussed need for patient to continue to obtain management and screenings with all established specialists, especially deferring to her cancer care specialists for referrals.  Educated patient at length about the critical importance of keeping health maintenance up to date.  - Participated in lengthy conversation and all questions were answered.  1. Pap Smear & Need for Lab Work - Screening for Cervical Cancer - Need for full set of blood work in near future. - Patient will have labs obtained through Dr. Lynelle Doctor of Oncology.  - Pap smear obtained today. - Last pap smear was 08/15/2015 done by Dr. Rex Kras, HPV not detected, and negative.  - Per patient, believes her last physical was in 2017. - Per patient, believes her last colonoscopy was 6 years ago. - Discussed need for yearly wellness/physical exam in near future.  - Advised patient to ask her cancer doctors regarding their recommendations for colonoscopy & other health screenings.  2. Stage 4 Metastatic Breast Cancer - Follows by ONC - Per patient, "they investigate every crevice of my body when I go to [facility]." - Follows up at [facility] every two weeks.  Has lab work, bone scans, CT scans, etc., done. - Per patient, states she has metastases to her bones and five organs. - Has begun treatment on Ibrance.  - Continue follow-up with specialists as prescribed. - Advised patient to ask oncology regarding what other specialty care she may need.  3. Mood Management - Patient enthusiastically declines mood medicine at this time.  - Extensively discussed  mood management during appointment today.  Counseling and education was provided, and all questions were answered.  - Advised patient that she may be managed on medication short-term. - Per patient, states she was managed on medication in the past, and they made her "really sleepy and I couldn't function."  She does not want to experience this again.  - STRONGLY encouraged patient to seek care with a life coach PRN.  Patient declined. - Advised patient to allow herself to feel what she feels.  - Encouraged patient to keep a mindfulness journal, pray, and practice self-love and personal care.  - Patient understands that she has many avenues for assistance, and does not have to face anything alone.   - Reviewed the "spokes of the wheel" of mood and health management.  Stressed the importance of ongoing prudent habits, including regular exercise, appropriate sleep hygiene, healthful dietary habits, and prayer/meditation to relax.  4. Lifestyle & Preventative Health Maintenance - Advised patient to continue working toward exercising to improve overall mental, physical, and emotional health.    - Encouraged patient to engage in daily physical activity as tolerated.  - Healthy dietary habits encouraged, including low-carb, and high amounts of lean protein in diet.   - Patient should also consume adequate amounts of water.   Medications Discontinued During This Encounter  Medication Reason  . enoxaparin (LOVENOX) 40 MG/0.4ML injection Completed Course  . methocarbamol (ROBAXIN) 500 MG tablet Completed Course  . ondansetron (ZOFRAN ODT) 4 MG disintegrating tablet Completed Course  Gross side effects, risk and benefits, and alternatives of medications and treatment plan in general discussed with patient.  Patient is aware that all medications have potential side effects and we are unable to predict every side effect or drug-drug interaction that may occur.   Patient will call with any  questions prior to using medication if they have concerns.    Expresses verbal understanding and consents to current therapy and treatment regimen.  No barriers to understanding were identified.  Red flag symptoms and signs discussed in detail.  Patient expressed understanding regarding what to do in case of emergency\urgent symptoms  Please see AVS handed out to patient at the end of our visit for further patient instructions/ counseling done pertaining to today's office visit.   Return for f/up 4-6 mo or sooner if concerns.     Note:  This note was prepared with assistance of Dragon voice recognition software. Occasional wrong-word or sound-a-like substitutions may have occurred due to the inherent limitations of voice recognition software.   This document serves as a record of services personally performed by Mellody Dance, DO. It was created on her behalf by Toni Amend, a trained medical scribe. The creation of this record is based on the scribe's personal observations and the provider's statements to them.   I have reviewed the above medical documentation for accuracy and completeness and I concur.  Mellody Dance, DO 08/29/2018 6:55 PM       ----------------------------------------------------------------------------------------------------------------------------    Subjective:     HPI: Karess Harner is a 63 y.o. female who presents to [place] at [facility] today for issues as discussed below.   Per patient, believes her last physical was in 2017.  Believes her last colonoscopy was probably 6 years ago.   Confirms that life keeps knocking her down, but she keeps getting back up.  Tries to drink plenty of water, "usually 60 ounces of water per day," along with juice.  Notes she also often eats protein bars.  Acute Stressors & Lifestyle - Personal Metastatic Breast Cancer, Dad's Cancer Continues to deal with her metastatic breast cancer, since  diagnosis in 2017.  States "they investigate every crevice of my body when I go to [facility] (CT scans, bone scans, etc.)."  Notes she has metastases to her bones and five organs.  Has begun Ibrance therapy.  States she was told "maybe 5-6 years, maybe longer."  States that her doctor saw something 3 months ago on her last CT scan; mentioned 4 weeks ago when patient decided to go on [place].  Had an ultrasound done on her uterus and may have fibroids present.  Notes that her dad also has stage 4 non-small-cell lung cancer.  She talks to him daily and they often have long conversations about life, alongside their problems.  Regarding her ongoing health issues, states she continues living her life.  "Most of the time I'm fine.  Arnie [her husband] doesn't talk about it much, and I don't mention it, and we just go on with our life and I'm fine."  States she sometimes gets emotional, but "as long as we're not talking about this or I'm not at a doctor's office, I'm fine."  Says her husband "does not want to know" about her health conditions.  He goes to every oncological appointment with her, he never asks any questions; "he sits there."  Once they say something shocking to her, notes she "doesn't hear another thing."  When he's talking to  our friends or people that know, he'll say "well she's been through a few things but she's doing just fine."  Says she often cries on her best friend's shoulder.  Says "I'm so tender-hearted now."  "I've been tough my whole life and always thought crying was a sign of weakness, but now I cry every time I go to the doctor."  She talks to her dad, and he talks to her, and they discuss different things.  "I don't know, every time I go to the doctor, I cry; and I leave and I go home and I just forget about everything and I go on with my life."  Says "I feel pretty good.  I have to be careful now."  Broken Leg Her dog knocked her down the steps and caused her to break her  leg.  "It's getting better, it's not as swollen and I'm walking on it better."  She is following up with Orthopedic Trauma Specialist, Dr. Altamese Mohave.  Notes "they usually don't do anything but Medicare or Medicaid patients, but he was on call that Friday or Saturday night."  Has seen Dr. Judee Clara each time she's gone.  Mood & Self-Care To take care of herself, she states she gets up "and puts my makeup on and I'm rolling my hair and trying to look nice," goes out to lunch or dinner sometimes with her friends.  Talks to her friends often.  Getting out to eat with her dad, etc.  Not currently managed on mood medications.  Continues to decline mood management or counseling/therapy.  Says that she was managed on something years ago during her divorce, and "I wanted to sleep all the time and I couldn't concentrate."    Wt Readings from Last 3 Encounters:  08/29/18 152 lb 14.4 oz (69.4 kg)  05/08/18 160 lb (72.6 kg)  03/07/18 158 lb 11.7 oz (72 kg)   BP Readings from Last 3 Encounters:  08/29/18 128/78  05/08/18 95/71  03/07/18 (!) 162/89   Pulse Readings from Last 3 Encounters:  08/29/18 90  05/08/18 72  03/07/18 85   BMI Readings from Last 3 Encounters:  08/29/18 25.44 kg/m  05/08/18 27.68 kg/m  03/07/18 26.41 kg/m     Patient Care Team    Relationship Specialty Notifications Start End  Mellody Dance, DO PCP - General Family Medicine  01/07/16   Rolm Bookbinder, MD Consulting Physician General Surgery  03/13/16   Gery Pray, MD Consulting Physician Radiation Oncology  03/13/16   Irene Limbo, MD Consulting Physician Plastic Surgery  04/10/16   Lenard Simmer, MD Attending Physician Endocrinology  05/11/16   [name], MD Consulting Physician Oncology  07/15/16      Patient Active Problem List   Diagnosis Date Noted  . Breast cancer metastasized to bone, left ([place]) 07/17/2018  . Lung nodule-  new spot followed by ONC docs 03/07/2018  . 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 Anxiety 05/11/2016  . Fatigue 05/11/2016  . Environmental and seasonal allergies 05/11/2016  . 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 Breast cancer ([place]) 04/14/2016  . Carcinoma of lower-inner quadrant of left breast ([place]) 03/30/2016  . Breast cancer of lower-inner quadrant of left female breast ([place]) 03/09/2016  . Former smoker 02/23/2016  . White coat syndrome without diagnosis of hypertension 02/23/2016  . Prehypertension 01/09/2016  . Hypothyroidism 01/08/2016  . Rhus dermatitis 01/07/2016  Past Medical history, Surgical history, Family history, Social history, Allergies and Medications have been entered into the medical record, reviewed and changed as needed.    Current Meds  Medication Sig  . acetaminophen (TYLENOL) 500 MG tablet Take 1 tablet (500 mg total) by mouth every 8 (eight) hours.  Marland Kitchen albuterol (PROVENTIL HFA) 108 (90 Base) MCG/ACT inhaler Inhale 1-2 puffs into the lungs every 6 (six) hours as needed for wheezing.  . Calcium Carbonate-Vitamin D (CALCIUM-VITAMIN D) 500-200 MG-UNIT tablet Take 2 tablets by mouth daily.   . Cholecalciferol (VITAMIN D3) 25 MCG (1000 UT) CAPS Take 1 capsule by mouth daily.   Marland Kitchen denosumab (XGEVA) 120 MG/1.7ML SOLN injection Inject 120 mg into the skin every 30 (thirty) days.  Marland Kitchen HYDROcodone-acetaminophen (NORCO) 7.5-325 MG tablet Take 1-2 tablets by mouth every 6 (six) hours as needed for moderate pain or severe pain.  Marland Kitchen letrozole (FEMARA) 2.5 MG tablet Take 2.5 mg by mouth daily.   . Multiple Vitamins-Minerals (ALIVE WOMENS 50+ PO) Take 1 tablet by mouth daily.   . palbociclib (IBRANCE) 125 MG capsule Take 125 mg by mouth daily with breakfast. Take whole with food. Take for 21 days on, 7 days off, repeat every 28 days.  . Probiotic Product (DIGESTIVE ADVANTAGE PO) Take 3 tablets by mouth daily.   Marland Kitchen thyroid (ARMOUR) 90 MG tablet Take 90 mg  by mouth daily.    Allergies:  Allergies  Allergen Reactions  . Clindamycin/Lincomycin Swelling    THROAT SWELLS  . Eggs Or Egg-Derived Products Swelling and Other (See Comments)    ABDOMINAL PAIN SWELLING, SWELLING REACTION UNSPECIFIED   Abdominal pain ABDOMINAL PAIN SWELLING, SWELLING REACTION UNSPECIFIED    . Ciprofloxacin Rash  . Codeine Hives and Swelling    SWELLING REACTION UNSPECIFIED Pt has tolerated Percocet   . Nickel Other (See Comments)    BLISTERS   . Penicillins Hives, Nausea And Vomiting, Swelling and Rash    SWELLING REACTION UNSPECIFIED  Has patient had PCN reaction causing immediate rash, facial/tongue/throat swelling, SOB or lightheadedness with hypotension: YES PCN reaction causing severe rash involving mucus membranes or skin necrosis: NO PCN reaction that required hospitalization NO PCN reaction occurring within the last 10 years: NO If all of the above answers are "NO", then may proceed with Cephalosporin use.  . Sulfa Antibiotics Hives and Swelling    SWELLING REACTION UNSPECIFIED   . Sulfasalazine Hives and Swelling    SWELLING REACTION UNSPECIFIED  . Gadolinium Derivatives Hives    After Brain and Breast MRIs  . Tamoxifen Other (See Comments)    Hands swollen, felt like hot flashes through her body, did not tolerate at all  . Eszopiclone Palpitations  . Neulasta [Pegfilgrastim] Palpitations    Stabbing back pain, sob     Review of Systems:  A fourteen system review of systems was performed and found to be positive as per HPI.   Objective:   Blood pressure 128/78, pulse 90, temperature 98 F (36.7 C), height 5\' 5"  (1.651 m), weight 152 lb 14.4 oz (69.4 kg), SpO2 100 %. Body mass index is 25.44 kg/m. General:  Well Developed, well nourished, appropriate for stated age.  Neuro:  Alert and oriented,  extra-ocular muscles intact  HEENT:  Normocephalic, atraumatic, neck supple, no carotid bruits appreciated  Skin:  no gross rash, warm,  pink. Cardiac:  RRR, S1 S2 Respiratory:  ECTA B/L and A/P, Not using accessory muscles, speaking in full sentences- unlabored. Vascular:  Ext warm, no cyanosis apprec.;  cap RF less 2 sec. Psych:  No HI/SI, judgement and insight good, Euthymic mood. Full Affect. Genitalia: Ext genitalia: without lesion, no rash or discharge. Pt tender on internal bi-man exam;  Cervix: surface appears friable approximately 2-3 mm out from the os, w/o discharge;   Adnexa:  No tenderness or palpable masses  Breast Exam: Implants present bilaterally, but no palpable masses or skin abnormality appreciated.

## 2018-08-31 LAB — CYTOLOGY - PAP
Diagnosis: NEGATIVE
HPV (WINDOPATH): NOT DETECTED

## 2018-09-06 ENCOUNTER — Telehealth: Payer: Self-pay | Admitting: Family Medicine

## 2018-09-06 ENCOUNTER — Other Ambulatory Visit: Payer: Self-pay

## 2018-09-06 DIAGNOSIS — Z13220 Encounter for screening for lipoid disorders: Secondary | ICD-10-CM

## 2018-09-06 NOTE — Telephone Encounter (Signed)
Patient called to confirm Lab orders were sent/or listed in Epic to [facility] so when she goes Thursday, Feb 6 for her OV with Dr. Derrill Center! 12 noon (not [place]) orders will be in place for them to draw in combination with [facility]'s doctor's orders.@ 11:10am  --Forwarding message to medical assistant to review w/ provider for (Lab orders to be entered in Epic or sent to [facility]) to the attn of Dr. Ysidro Evert Force or his PA Arrie Aran Barringer @ [employer]  [phone].. fax# [phone].  Patient also request she be called as soon as orders sent/ called to [facility] as confirmation & needs to know if she needs to fast for this blood draw??  --glh

## 2018-09-06 NOTE — Telephone Encounter (Signed)
Order is in the chart now.  Do we need to fax it to them or will they be able to see the order in epic? MPulliam, CMA/RT(R)

## 2018-09-13 ENCOUNTER — Telehealth: Payer: Self-pay | Admitting: Family Medicine

## 2018-09-13 NOTE — Telephone Encounter (Signed)
Patient called states her Dad who lives with them tested Positive for the Flu ---- she & husband / Laura Landry- DOB 12.15.60 have been exposed to Flu --Pt is requesting PCP order them both Tamiflu at :   [place], [place] [phone] (Phone) [phone] (Fax)   Forwarding request to medical assistant that if approved & possible to call in by 10:00 am as she is going to pick up Rxs for her father at that time.  --glh

## 2018-09-13 NOTE — Telephone Encounter (Signed)
Pt's last GFR was done 05/08/2018 and was >60.  Please review request and authorize if appropriate.  Charyl Bigger, CMA

## 2018-09-14 MED ORDER — OSELTAMIVIR PHOSPHATE 75 MG PO CAPS: 75.0000 mg | ORAL_CAPSULE | Freq: Every day | ORAL | 0 refills | Status: DC

## 2018-09-14 NOTE — Telephone Encounter (Signed)
Yes, I already spoke with nmelissaabout this yesterday along with [name] when Mrs [facility] called back.  I oked this and gave verbal to [facility] yest afternoon.

## 2018-09-15 ENCOUNTER — Other Ambulatory Visit (INDEPENDENT_AMBULATORY_CARE_PROVIDER_SITE_OTHER): Payer: BLUE CROSS/BLUE SHIELD

## 2018-09-15 DIAGNOSIS — Z13220 Encounter for screening for lipoid disorders: Secondary | ICD-10-CM

## 2018-09-16 LAB — LIPID PANEL
Chol/HDL Ratio: 2.5 ratio (ref 0.0–4.4)
Cholesterol, Total: 179 mg/dL (ref 100–199)
HDL: 73 mg/dL (ref >39)
LDL Calculated: 91 mg/dL (ref 0–99)
TRIGLYCERIDES: 75 mg/dL (ref 0–149)
VLDL CHOLESTEROL CAL: 15 mg/dL (ref 5–40)

## 2018-11-01 ENCOUNTER — Other Ambulatory Visit: Payer: Self-pay | Admitting: Family Medicine

## 2018-11-03 DIAGNOSIS — R9389 Abnormal findings on diagnostic imaging of other specified body structures: Secondary | ICD-10-CM | POA: Insufficient documentation

## 2019-01-20 ENCOUNTER — Encounter: Payer: Self-pay | Admitting: Gastroenterology

## 2019-06-21 ENCOUNTER — Other Ambulatory Visit: Payer: Self-pay | Admitting: Family Medicine

## 2019-06-21 ENCOUNTER — Telehealth: Payer: Self-pay | Admitting: Family Medicine

## 2019-06-21 DIAGNOSIS — Z23 Encounter for immunization: Secondary | ICD-10-CM

## 2019-06-21 MED ORDER — PNEUMOVAX 23 25 MCG/0.5ML IJ INJ: 0.5000 mL | INJECTION | INTRAMUSCULAR | 0 refills | Status: AC

## 2019-06-21 MED ORDER — PROAIR HFA 108 (90 BASE) MCG/ACT IN AERS: INHALATION_SPRAY | RESPIRATORY_TRACT | 0 refills | Status: DC

## 2019-06-21 NOTE — Telephone Encounter (Signed)
Pneumococcal vaccine ordered per patient request.AS, CMA

## 2019-06-21 NOTE — Telephone Encounter (Signed)
Patient has not been seen since 1/20 and was advised to follow up with provider in 4-6 months at that time. I have sent in the albuterol inhaler for patient as well as the pneumococcal vaccine. Can you please contact patient to schedule. AS, CMA

## 2019-06-21 NOTE — Telephone Encounter (Signed)
Patient called states switching pharmacy to [facility] ([facility]).  --Pt has 2 request :   1)---needs refill on : ([facility] won't gv the refill noted on Rx ) that's why she's leaving multiple problems with them.  PROAIR HFA 108 (90 Base) MCG/ACT inhaler [id number]:[id number]   Order Details Dose, Route, Frequency: As Directed  Dispense Quantity: 9 g Refills: 1 Fills remaining: --        Sig: INHALE 1 TO 2 PUFFS BY MOUTH EVERY 6 HOURS AS NEEDED FOR WHEEZING     2)--- Pt's BCBS requires her to have Pneumonia vaccine to be given @ a (Pharmacy)---pls call in Rx for that too.   --Forwarding request to med asst to send Rx orders to :   Preferred Pharmacies      [facility], [place]. [phone] (Phone) [phone] (Fax)   ---glh

## 2019-08-08 ENCOUNTER — Other Ambulatory Visit: Payer: Self-pay

## 2019-08-08 ENCOUNTER — Ambulatory Visit: Payer: BC Managed Care – PPO | Admitting: Family Medicine

## 2019-08-08 ENCOUNTER — Encounter: Payer: Self-pay | Admitting: Family Medicine

## 2019-08-08 DIAGNOSIS — R309 Painful micturition, unspecified: Secondary | ICD-10-CM

## 2019-08-08 DIAGNOSIS — D849 Immunodeficiency, unspecified: Secondary | ICD-10-CM

## 2019-08-08 DIAGNOSIS — J989 Respiratory disorder, unspecified: Secondary | ICD-10-CM | POA: Insufficient documentation

## 2019-08-08 DIAGNOSIS — R311 Benign essential microscopic hematuria: Secondary | ICD-10-CM | POA: Insufficient documentation

## 2019-08-08 DIAGNOSIS — C50312 Malignant neoplasm of lower-inner quadrant of left female breast: Secondary | ICD-10-CM

## 2019-08-08 DIAGNOSIS — R0989 Other specified symptoms and signs involving the circulatory and respiratory systems: Secondary | ICD-10-CM | POA: Diagnosis not present

## 2019-08-08 DIAGNOSIS — D259 Leiomyoma of uterus, unspecified: Secondary | ICD-10-CM

## 2019-08-08 DIAGNOSIS — C7951 Secondary malignant neoplasm of bone: Secondary | ICD-10-CM

## 2019-08-08 DIAGNOSIS — Z79899 Other long term (current) drug therapy: Secondary | ICD-10-CM

## 2019-08-08 DIAGNOSIS — Z170 Estrogen receptor positive status [ER+]: Secondary | ICD-10-CM

## 2019-08-08 DIAGNOSIS — C50912 Malignant neoplasm of unspecified site of left female breast: Secondary | ICD-10-CM

## 2019-08-08 LAB — POCT URINALYSIS DIPSTICK
Bilirubin, UA: NEGATIVE
Glucose, UA: NEGATIVE
Ketones, UA: NEGATIVE
Leukocytes, UA: NEGATIVE
Nitrite, UA: NEGATIVE
Protein, UA: NEGATIVE
Spec Grav, UA: 1.01 (ref 1.010–1.025)
Urobilinogen, UA: 0.2 E.U./dL
pH, UA: 6.5 (ref 5.0–8.0)

## 2019-08-08 MED ORDER — PROAIR HFA 108 (90 BASE) MCG/ACT IN AERS: INHALATION_SPRAY | RESPIRATORY_TRACT | 1 refills | Status: DC

## 2019-08-08 NOTE — Progress Notes (Signed)
Telehealth office visit note for Laura Landry, D.O- at Primary Care at [facility]   I connected with current patient today and verified that I am speaking with the correct person using two identifiers.   . Location of the patient: Home . Location of the provider: Office Only the patient (+/- their family members at pt's discretion) and myself were participating in the encounter - This visit type was conducted due to national recommendations for restrictions regarding the COVID-19 Pandemic (e.g. social distancing) in an effort to limit this patient's exposure and mitigate transmission in our community.  This format is felt to be most appropriate for this patient at this time.   - The patient did not have access to video technology or had technical difficulties with video requiring transitioning to audio format only. - No physical exam could be performed with this format, beyond that communicated to [place] by the patient/ family members as noted.   - Additionally my office staff/ schedulers discussed with the patient that there may be a monetary charge related to this service, depending on their medical insurance.   The patient expressed understanding, and agreed to proceed.       History of Present Illness: Dysuria    I, Toni Amend, am serving as scribe for Dr. Mellody Landry.    Patient states she is doing okay today.  She's trying her best to stay home most of the time, aside from when she goes to visit her dad.   - COVID-19 Test, UTI Concerns Notes on December 12th, she went to [id number].  Called the office here on that Monday because she woke up on Sunday with a fever of 100.2 and chills.  Notes she had no symptoms of a UTI at that time, period.  Her concerns were for COVID at first.  She had gone with her father to [place] 10 days prior, "and he always insists on parking with the car service there, and the kid that got out of our car didn't have a mask or  gloves on."  At that time, after this, she went to get tested.  Since Thanksgiving, notes she's been "extremely drowsy, not much energy," and thought the Ibrance was "tearing her up and bringing her down."  States during this time, she did not feel well.  Notes again that on the 11th of December, she went to sleep at 4:30 and woke up soaking wet, sweating, with chills, and took her temperature.  She took some tylenol, waited thirty minutes, and took her temperature again and saw it had gone up to 100.4.  Says she had chills, fever, and diarrhea as well at that time.  When she went to [facility], she had a COVID-19 test obtained, along with flu A and flu B, and these came back negative.  That Wednesday after, her back was killing her and her stomach was hurting.   Notes they started her on doxycycline even though the urine had not yet been cultured.  Notes on the 22nd, she would have finished her doxycycline.  However, that day, she went to see her Oncologist Dr. Ysidro Evert Force, and "he said that it appeared that the UTI was so bad probably because of the Ibrance."  The patient was placed on another week's worth of doxycycline, which she finished this past Friday (4 days ago).  Notes she was managed on 14 days total of doxy.  Patient was told to follow up with her  PCP afterward to make sure her UTI is completely gone.  Notes she is feeling a lot better now.  She was throwing up during her period of symptoms, but never knew she had a UTI.  Her symptoms did not resemble what she thinks of as UTI.  "It never hurt when I went to the bathroom; I had no clue what was wrong with me."   States the diarrhea tends to come and go with Ibrance.   - Female Health Notes they saw something in her uterus two or three CT scans ago, and was sent to gyn-onc for biopsy.  States that the biopsy was very unpleasant and the spot was found to be a benign fibroid.  States "it's not bothering me and we're just leaving it  alone."   - Breathing; RAD Notes she has breathing problems every once in a while.  "It's not been as bad because I haven't been outside as much (especially in the cold)."  Notes when she gets on the highway, "the fumes, they kill me," and she gets to coughing. But right now- well controlled sx    Depression screen [facility] 2/9 08/08/2019 08/29/2018 03/07/2018 07/13/2016 01/07/2016  Decreased Interest 0 0 0 0 0  Down, Depressed, Hopeless 0 0 0 0 0  PHQ - 2 Score 0 0 0 0 0  Altered sleeping 2 2 0 - -  Tired, decreased energy 2 1 0 - -  Change in appetite 0 0 0 - -  Feeling bad or failure about yourself  0 0 0 - -  Trouble concentrating 0 0 0 - -  Moving slowly or fidgety/restless 0 0 0 - -  Suicidal thoughts 0 0 0 - -  PHQ-9 Score 4 3 0 - -  Difficult doing work/chores Not difficult at all - Not difficult at all - -    Urinalysis    Component Value Date/Time   BILIRUBINUR neg 08/08/2019 1001   PROTEINUR Negative 08/08/2019 1001   UROBILINOGEN 0.2 08/08/2019 1001   NITRITE neg 08/08/2019 1001   LEUKOCYTESUR Negative 08/08/2019 1001     Impression and Recommendations:    1. Benign essential microscopic hematuria   2. Urinary pain   3. Uterine leiomyoma, unspecified location- benign   4. Reactive airway disease that is not asthma   5. Breast cancer metastasized to bone, left ([place])   6. Malignant neoplasm of lower-inner quadrant of left breast in female, estrogen receptor positive ([place])   7. High risk medication use   8. Immunocompromised state ([place])- due to CA txmnts      Benign Essential Microscopic Hematuria, Urinary Pain - Patient asymptomatic, stable at this time. - Discussed that urinalysis today returned completely WNL. - Given patient's history, urine sent for culture today. - Extensively discussed patient's recent UTI and associated history.  Explained this should be N. - Per patient, was managed on 14 days of doxycycline.  - Reviewed policies and practices at the  clinic.   Covid Counseling:  - Discussed healthcare recommendations and guidelines during COVID.  - With patient's history, advised patient to stay out of the doctor's office as often as possible, and away from other people during the pandemic.   Reactive Airway Disease - Symptoms stable at this time; controlled on occasional use of inhaler. - Refill for inhaler provided today. - Will continue to monitor.   Recommendations - Due for next pap smear in 2023. - Last lab work obtained February 2020.   - As part  of my medical decision making, I reviewed the following data within the [place] History obtained from pt /family, CMA notes reviewed and incorporated if applicable, Labs reviewed, Radiograph/ tests reviewed if applicable and OV notes from prior OV's with me, as well as other specialists she/he has seen since seeing me last, were all reviewed and used in my medical decision making process today.    - Additionally, discussion had with patient regarding our treatment plan, and their biases/concerns about that plan were used in my medical decision making today.    - The patient agreed with the plan and demonstrated an understanding of the instructions.   No barriers to understanding were identified.    - Red flag symptoms and signs discussed in detail.  Patient expressed understanding regarding what to do in case of emergency\ urgent symptoms.   - The patient was advised to call back or seek an in-person evaluation if the symptoms worsen or if the condition fails to improve as anticipated.   Return if symptoms worsen or fail to improve, for f/up 6 months chronic care; she will f/up her ONC team freq until then.    Orders Placed This Encounter  Procedures  . Urine Culture  . POC Urinalysis dipstick    Meds ordered this encounter  Medications  . PROAIR HFA 108 (90 Base) MCG/ACT inhaler    Sig: INHALE 1 TO 2 PUFFS BY MOUTH EVERY 6 HOURS AS NEEDED FOR WHEEZING     Dispense:  18 g    Refill:  1    Medications Discontinued During This Encounter  Medication Reason  . HYDROcodone-acetaminophen (NORCO) 7.5-325 MG tablet Error  . oseltamivir (TAMIFLU) 75 MG capsule Error  . Probiotic Product (DIGESTIVE ADVANTAGE PO) Error  . PROAIR HFA 108 (90 Base) MCG/ACT inhaler Reorder     I provided 21+ minutes of non face-to-face time during this encounter.  Additional time was spent with charting and coordination of care before and after the actual visit commenced.   Note:  This note was prepared with assistance of Dragon voice recognition software. Occasional wrong-word or sound-a-like substitutions may have occurred due to the inherent limitations of voice recognition software.  This document serves as a record of services personally performed by Laura Dance, DO. It was created on her behalf by Toni Amend, a trained medical scribe. The creation of this record is based on the scribe's personal observations and the provider's statements to them.   This case required medical decision making of at least moderate complexity. The above documentation has been reviewed to be accurate and was completed by Marjory Sneddon, D.O.       Patient Care Team    Relationship Specialty Notifications Start End  Laura Dance, DO PCP - General Family Medicine  01/07/16   Rolm Bookbinder, MD Consulting Physician General Surgery  03/13/16   Gery Pray, MD Consulting Physician Radiation Oncology  03/13/16   Irene Limbo, MD Consulting Physician Plastic Surgery  04/10/16   Lenard Simmer, MD Attending Physician Endocrinology  05/11/16   [name], MD Consulting Physician Oncology  07/15/16   Force, Shari Prows, DO Referring Physician Student  09/06/18      -Vitals obtained; medications/ allergies reconciled;  personal medical, social, Sx etc.histories were updated by CMA, reviewed by me and are reflected in chart   Patient Active Problem List    Diagnosis Date Noted  . Reactive airway disease that is not asthma 08/08/2019  . Uterine leiomyoma  08/08/2019  . Benign essential microscopic hematuria 08/08/2019  . Immunocompromised state ([place])- due to CA txmnts 08/08/2019  . High risk medication use 08/08/2019  . Thickened endometrium 11/03/2018  . Breast cancer metastasized to bone, left ([place]) 07/17/2018  . Lung nodule-  new spot followed by ONC docs 03/07/2018  . 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, followed by oncology at [facility] 03/07/2018  . Anxiety 05/11/2016  . Fatigue 05/11/2016  . Environmental and seasonal allergies 05/11/2016  . 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 Breast cancer ([place]) 04/14/2016  . Carcinoma of lower-inner quadrant of left breast ([place]) 03/30/2016  . Malignant neoplasm of lower-inner quadrant of left breast in female, estrogen receptor positive ([place]) 03/09/2016  . Former smoker 02/23/2016  . White coat syndrome without diagnosis of hypertension 02/23/2016  . Prehypertension 01/09/2016  . Hypothyroidism 01/08/2016  . Rhus dermatitis 01/07/2016     Current Meds  Medication Sig  . Cholecalciferol (VITAMIN D3) 25 MCG (1000 UT) CAPS Take 1 capsule by mouth daily.   Marland Kitchen denosumab (XGEVA) 120 MG/1.7ML SOLN injection Inject 120 mg into the skin every 30 (thirty) days.  Marland Kitchen letrozole (FEMARA) 2.5 MG tablet Take 2.5 mg by mouth daily.   . Multiple Vitamins-Minerals (ALIVE WOMENS 50+ PO) Take 1 tablet by mouth daily.   . palbociclib (IBRANCE) 125 MG capsule Take 125 mg by mouth daily with breakfast. Take whole with food. Take for 21 days on, 7 days off, repeat every 28 days.  Marland Kitchen PROAIR HFA 108 (90 Base) MCG/ACT inhaler INHALE 1 TO 2 PUFFS BY MOUTH EVERY 6 HOURS AS NEEDED FOR WHEEZING  . thyroid (ARMOUR) 90 MG tablet Take 90 mg by mouth daily.  . [DISCONTINUED] PROAIR HFA 108 (90 Base) MCG/ACT inhaler INHALE 1 TO 2 PUFFS BY MOUTH EVERY 6 HOURS AS NEEDED FOR WHEEZING      Allergies:  Allergies  Allergen Reactions  . Clindamycin/Lincomycin Swelling    THROAT SWELLS  . Eggs Or Egg-Derived Products Swelling and Other (See Comments)    ABDOMINAL PAIN SWELLING, SWELLING REACTION UNSPECIFIED   Abdominal pain ABDOMINAL PAIN SWELLING, SWELLING REACTION UNSPECIFIED    . Ciprofloxacin Rash  . Codeine Hives and Swelling    SWELLING REACTION UNSPECIFIED Pt has tolerated Percocet   . Nickel Other (See Comments)    BLISTERS   . Penicillins Hives, Nausea And Vomiting, Swelling and Rash    SWELLING REACTION UNSPECIFIED  Has patient had PCN reaction causing immediate rash, facial/tongue/throat swelling, SOB or lightheadedness with hypotension: YES PCN reaction causing severe rash involving mucus membranes or skin necrosis: NO PCN reaction that required hospitalization NO PCN reaction occurring within the last 10 years: NO If all of the above answers are "NO", then may proceed with Cephalosporin use.  . Sulfa Antibiotics Hives and Swelling    SWELLING REACTION UNSPECIFIED   . Sulfasalazine Hives and Swelling    SWELLING REACTION UNSPECIFIED  . Gadolinium Derivatives Hives    After Brain and Breast MRIs  . Tamoxifen Other (See Comments)    Hands swollen, felt like hot flashes through her body, did not tolerate at all  . Eszopiclone Palpitations  . Neulasta [Pegfilgrastim] Palpitations    Stabbing back pain, sob     ROS:  See above HPI for pertinent positives and negatives   Objective:   There were no vitals taken for this visit.  (if some vitals are omitted, this means that patient was UNABLE to obtain them even though they  were asked to get them prior to OV today.  They were asked to call us at their earliest convenience with these once obtained. )  General: A & O * 3; sounds in no acute distress; in usual state of health.  Skin: Pt confirms warm and dry extremities and pink fingertips HEENT: Pt confirms lips non-cyanotic Chest: Patient  confirms normal chest excursion and movement Respiratory: speaking in full sentences, no conversational dyspnea; patient confirms no use of accessory muscles Psych: insight appears good, mood- appears full

## 2019-08-10 LAB — URINE CULTURE

## 2019-12-21 ENCOUNTER — Telehealth: Payer: Self-pay | Admitting: Physician Assistant

## 2019-12-21 MED ORDER — FLUCONAZOLE 100 MG PO TABS: 100.0000 mg | ORAL_TABLET | Freq: Every day | ORAL | 0 refills | Status: DC

## 2019-12-21 NOTE — Telephone Encounter (Signed)
Patient calling office stating that she has a white coating on her tongue. She used a tooth brush and baking soda and scrubbed her tongue and now it is bleeding and painful.  Possible thrush?  Patient is a cancer patient and being treated by oncology. I advised her to call oncology and let them know what symptoms she is having incase it is a side effect from the chemo. Patient is agreeable.   I advised patient we do not have any available apts at this time and advised her to go to [facility] for evaluation and treatment.  Patient states her immune system is too weak to go to UC or ED at this time.   Are we able to treat patient for thrush without seeing her?   Please advise. AS, CMA

## 2019-12-21 NOTE — Addendum Note (Signed)
Addended by: Mickel Crow on: 12/21/2019 01:23 PM   Modules accepted: Orders

## 2019-12-21 NOTE — Telephone Encounter (Signed)
Thrush on tongue can usually be scraped and leave a red, inflamed area that can bleed. If oncologist doesn't think it is related to chemo, I am willing to treat without being seen since there are no available appointments and patient is immunodeficient.   Thank you, Herb Grays

## 2019-12-21 NOTE — Telephone Encounter (Signed)
Patient has not been able to get in touch with Oncologist.   Per Herb Grays sending Diflucan 100mg  1 PO QD x 7 days.   Patient is aware and verbalized understanding. AS, CMA

## 2021-02-02 ENCOUNTER — Other Ambulatory Visit: Payer: Self-pay

## 2021-02-02 ENCOUNTER — Ambulatory Visit
Admission: EM | Admit: 2021-02-02 | Discharge: 2021-02-02 | Disposition: A | Discharge status: Home / Self Care | Payer: BC Managed Care – PPO | Attending: Family Medicine | Admitting: Family Medicine

## 2021-02-02 DIAGNOSIS — U071 COVID-19: Secondary | ICD-10-CM | POA: Diagnosis not present

## 2021-02-02 MED ORDER — MOLNUPIRAVIR EUA 200MG CAPSULE: 4.0000 | ORAL_CAPSULE | Freq: Two times a day (BID) | ORAL | 0 refills | Status: AC

## 2021-02-02 NOTE — Discharge Instructions (Addendum)
Rest. Lots of fluids.  Stay home. Samule Dry is for 5 days.  If you worsen, go to the ER.  Take care  Dr. Lacinda Axon

## 2021-02-02 NOTE — ED Provider Notes (Signed)
[facility]    CSN: [id number] Arrival date & time: 02/02/21  0932      History   Chief Complaint Chief Complaint  Patient presents with   Sore Throat    Headache, wheezing x this morning. COVID19 Home test - Positive     Cough    X this am  - clear    HPI  65 year old female presents with the above complaints.  Patient states that she felt poorly last night.  Woke up this morning and had headache, congestion, cough.  She states that she has taken Tylenol and some nasal spray without resolution.  Denies fever.  She took a home COVID test which was positive this morning.  Patient requesting antiviral medication given her age and risk factors.  Denies shortness of breath.  She has no other complaints or concerns at this time.  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 04/22/2016   Breast cancer ([place]) 04/14/2016   Carcinoma of lower-inner quadrant of left breast ([place]) 03/30/2016   Malignant neoplasm of  lower-inner quadrant of left breast in female, estrogen receptor positive ([place]) 03/09/2016   Former smoker 02/23/2016   White coat syndrome without diagnosis of hypertension 02/23/2016   Prehypertension 01/09/2016   Hypothyroidism 01/08/2016   Rhus dermatitis 01/07/2016    Past Surgical History:  Procedure Laterality Date   BREAST ENHANCEMENT SURGERY Right 08/25/2016   Procedure: RIGHT BREAST AUGMENTATION FOR SYMMETRY;  Surgeon: Irene Limbo, MD;  Location: [place];  Service: Plastics;  Laterality: Right;   BREAST RECONSTRUCTION WITH PLACEMENT OF TISSUE EXPANDER AND FLEX HD (ACELLULAR HYDRATED DERMIS) Left 04/14/2016   Procedure: LEFT BREAST RECONSTRUCTION WITH PLACEMENT OF TISSUE EXPANDER;  Surgeon: Irene Limbo, MD;  Location: [place];  Service: Plastics;  Laterality: Left;   BREAST SURGERY Left 04/14/2016   mastectomy   COLONOSCOPY W/ POLYPECTOMY     EYE SURGERY Left    laser surgery due to high  pressure   NIPPLE SPARING MASTECTOMY/SENTINAL LYMPH NODE BIOPSY/RECONSTRUCTION/PLACEMENT OF TISSUE EXPANDER Left 04/14/2016   Procedure: LEFT NIPPLE SPARING MASTECTOMY WITH LEFT AXILLARY SENTINEL LYMPH NODE BIOPSY;  Surgeon: Rolm Bookbinder, MD;  Location: [place];  Service: General;  Laterality: Left;   ORIF ANKLE FRACTURE Right 05/08/2018   Procedure: RIGHT ANKLE FRACTURE REPAIR;  Surgeon: Altamese Calvin, MD;  Location: [place];  Service: Orthopedics;  Laterality: Right;   PERIPHERAL VASCULAR CATHETERIZATION  Right 08/25/2016   Procedure: PORTA CATH REMOVAL;  Surgeon: Irene Limbo, MD;  Location: [place];  Service: Plastics;  Laterality: Right;   PORTACATH PLACEMENT N/A 05/07/2016   Procedure: INSERTION PORT-A-CATH WITH [place];  Surgeon: Rolm Bookbinder, MD;  Location: [place];  Service: General;  Laterality: N/A;   REMOVAL OF TISSUE EXPANDER AND PLACEMENT OF IMPLANT Left 08/25/2016   Procedure: REMOVAL OF LEFT TISSUE EXPANDER AND PLACEMENT OF SALINE IMPLANT;   Surgeon: Irene Limbo, MD;  Location: [place];  Service: Plastics;  Laterality: Left;   WISDOM TOOTH EXTRACTION      OB History   No obstetric history on file.      Home Medications    Prior to Admission medications   Medication Sig Start Date End Date Taking? Authorizing Provider  acetaminophen (TYLENOL) 500 MG tablet Take 1 tablet (500 mg total) by mouth every 8 (eight) hours. 05/08/18  Yes Ainsley Spinner, PA-C  Calcium Carbonate-Vitamin D (CALCIUM-VITAMIN D) 500-200 MG-UNIT tablet Take 2 tablets by mouth daily.    Yes [provider]  Cholecalciferol (VITAMIN D3) 25 MCG (1000 UT) CAPS Take 1 capsule by mouth daily.    Yes [provider]  denosumab (XGEVA) 120 MG/1.7ML SOLN injection Inject 120 mg into the skin every 30 (thirty) days.   Yes [provider]  letrozole (FEMARA) 2.5 MG tablet Take 2.5 mg by mouth daily.  12/03/17  Yes [provider]  molnupiravir EUA 200 mg CAPS Take 4 capsules (800 mg total) by mouth 2 (two) times daily for 5 days. 02/02/21 02/07/21 Yes Harshitha Fretz G, DO  Multiple Vitamins-Minerals (ALIVE WOMENS 50+ PO) Take 1 tablet by mouth daily.    Yes [provider]  palbociclib (IBRANCE) 125 MG capsule Take 125 mg by mouth daily with breakfast. Take whole with food. Take for 21 days on, 7 days off, repeat every 28 days.   Yes [provider]  PROAIR HFA 108 (90 Base) MCG/ACT inhaler INHALE 1 TO 2 PUFFS BY MOUTH EVERY 6 HOURS AS NEEDED FOR WHEEZING 08/08/19  Yes Opalski, Neoma Laming, DO  thyroid (ARMOUR) 90 MG tablet Take 90 mg by mouth daily.   Yes [provider]    Family History Family History  Problem Relation Age of Onset   Colon cancer Paternal Grandmother    Cancer Paternal Grandmother        colon   Cancer Mother        breast   Hypertension Father    Asthma Father    Diabetes Brother    Ovarian cancer Paternal Aunt    Ovarian cancer Maternal Aunt     Social History Social  History   Tobacco Use   Smoking status: Former    Pack years: 0.00    Types: Cigarettes    Quit date: 08/03/2012    Years since quitting: 8.5   Smokeless tobacco: Never  Substance Use Topics   Alcohol use: Yes    Alcohol/week: 4.0 standard drinks    Types: 4 Glasses of wine per week   Drug use: No     Allergies   Clindamycin/lincomycin, Eggs or egg-derived products, Ciprofloxacin, Codeine, Nickel, Penicillins, Sulfa antibiotics, Sulfasalazine, Gadolinium derivatives, Tamoxifen, Eszopiclone, and Neulasta [pegfilgrastim]   Review of Systems Review of Systems  Constitutional:  Positive for fatigue. Negative for fever.  HENT:  Positive for congestion.   Respiratory:  Positive for cough.     Physical Exam Triage Vital Signs ED Triage Vitals  Enc Vitals Group     BP 02/02/21 0951 122/80     Pulse Rate 02/02/21 0951 84     Resp 02/02/21 0951 16     Temp 02/02/21 0951 97.9 F (36.6 C)     Temp Source 02/02/21 0951 Oral     SpO2 02/02/21 0951 97 %     Weight --      Height --      Head Circumference --      Peak Flow --      Pain Score 02/02/21 0954 2     Pain Loc --      Pain Edu? --      Excl. in [place]? --    Updated Vital Signs BP 122/80 (BP Location: Right Arm)   Pulse 84   Temp 97.9 F (36.6 C) (Oral)   Resp 16   SpO2 97%   Visual Acuity Right Eye Distance:   Left Eye Distance:   Bilateral Distance:    Right Eye Near:   Left Eye Near:    Bilateral Near:     Physical Exam Constitutional:      General: She is not in acute distress.    Appearance: She is well-developed. She is not ill-appearing.  HENT:     Head: Normocephalic and atraumatic.  Eyes:     General:        Right eye: No discharge.        Left eye: No discharge.     Conjunctiva/sclera: Conjunctivae normal.  Cardiovascular:     Rate and Rhythm: Normal rate and regular rhythm.     Heart sounds: No murmur heard. Pulmonary:     Effort: Pulmonary effort is normal.     Breath sounds: Normal  breath sounds. No wheezing, rhonchi or rales.  Neurological:     Mental Status: She is alert.  Psychiatric:        Mood and Affect: Mood normal.        Behavior: Behavior normal.     UC Treatments / Results  Labs (all labs ordered are listed, but only abnormal results are displayed) Labs Reviewed - No data to display  EKG   Radiology No results found.  Procedures Procedures (including critical care time)  Medications Ordered in UC Medications - No data to display  Initial Impression / Assessment and Plan / UC Course  I have reviewed the triage vital signs and the nursing notes.  Pertinent labs & imaging results that were available during my care of the patient were reviewed by me and considered in my medical decision making (see chart for details).     65 year old female presents with COVID-19.  Given risk factors, placing on antiviral treatment with molnupiravir.  Paxlovid was not used as it interacts with her Ibrance.  Final diagnoses:  COVID     Discharge Instructions      Rest. Lots of fluids.  Stay home. Samule Dry is for 5 days.  If you worsen, go to the ER.  Take care  Dr. Lacinda Axon    ED Prescriptions     Medication Sig Dispense Auth. Provider   molnupiravir EUA 200 mg CAPS Take 4 capsules (800 mg total) by mouth 2 (two) times daily for 5 days. 40 capsule Coral Spikes, DO      PDMP not reviewed this encounter.   Coral Spikes, DO 02/02/21 1022

## 2021-02-02 NOTE — ED Triage Notes (Signed)
Patient presents today with complaints of a sore throat, headache and wheezing x this am. Patient states she took a Covid 19 home test this morning - positive. She states she took OTC Tylenol x 1 around 5am - has had some relief. Patient is requesting anti-viral medication.

## 2022-01-19 ENCOUNTER — Other Ambulatory Visit: Payer: Self-pay | Admitting: Pharmacist

## 2024-06-17 DIAGNOSIS — I4892 Unspecified atrial flutter: Secondary | ICD-10-CM | POA: Diagnosis not present

## 2024-06-17 DIAGNOSIS — I441 Atrioventricular block, second degree: Secondary | ICD-10-CM | POA: Diagnosis not present

## 2024-06-18 DIAGNOSIS — R188 Other ascites: Secondary | ICD-10-CM | POA: Diagnosis not present

## 2024-06-18 DIAGNOSIS — I4892 Unspecified atrial flutter: Secondary | ICD-10-CM | POA: Diagnosis not present

## 2024-06-18 DIAGNOSIS — C7951 Secondary malignant neoplasm of bone: Secondary | ICD-10-CM

## 2024-06-18 DIAGNOSIS — I517 Cardiomegaly: Secondary | ICD-10-CM | POA: Diagnosis not present

## 2024-06-18 DIAGNOSIS — C786 Secondary malignant neoplasm of retroperitoneum and peritoneum: Secondary | ICD-10-CM | POA: Diagnosis not present

## 2024-06-18 DIAGNOSIS — I361 Nonrheumatic tricuspid (valve) insufficiency: Secondary | ICD-10-CM | POA: Diagnosis not present

## 2024-06-18 DIAGNOSIS — I272 Pulmonary hypertension, unspecified: Secondary | ICD-10-CM

## 2024-06-18 DIAGNOSIS — D649 Anemia, unspecified: Secondary | ICD-10-CM | POA: Diagnosis not present

## 2024-06-18 DIAGNOSIS — C787 Secondary malignant neoplasm of liver and intrahepatic bile duct: Secondary | ICD-10-CM

## 2024-07-03 DEATH — deceased
